# Patient Record
Sex: Male | Born: 1940 | Race: Black or African American | Hispanic: No | Marital: Married
Health system: Southern US, Community
[De-identification: ages and names within clinical notes are randomized; demographics above are authoritative.]

---

## 1997-05-23 ENCOUNTER — Encounter (HOSPITAL_COMMUNITY): Admission: RE | Admit: 1997-05-23 | Discharge: 1997-08-21 | Payer: Self-pay | Admitting: Cardiology

## 2002-02-15 ENCOUNTER — Encounter: Payer: Self-pay | Admitting: Emergency Medicine

## 2002-02-16 ENCOUNTER — Encounter: Payer: Self-pay | Admitting: Neurology

## 2002-02-16 ENCOUNTER — Inpatient Hospital Stay (HOSPITAL_COMMUNITY): Admission: EM | Admit: 2002-02-16 | Discharge: 2002-03-10 | Payer: Self-pay | Admitting: Emergency Medicine

## 2002-02-16 ENCOUNTER — Encounter: Payer: Self-pay | Admitting: Emergency Medicine

## 2002-02-16 ENCOUNTER — Encounter: Payer: Self-pay | Admitting: Cardiology

## 2002-02-17 ENCOUNTER — Encounter: Payer: Self-pay | Admitting: Emergency Medicine

## 2002-02-18 ENCOUNTER — Encounter: Payer: Self-pay | Admitting: Neurosurgery

## 2002-02-18 ENCOUNTER — Encounter: Payer: Self-pay | Admitting: Neurology

## 2002-02-19 ENCOUNTER — Encounter: Payer: Self-pay | Admitting: Neurology

## 2002-02-19 ENCOUNTER — Encounter: Payer: Self-pay | Admitting: Neurosurgery

## 2002-02-21 ENCOUNTER — Encounter: Payer: Self-pay | Admitting: Neurology

## 2002-03-04 ENCOUNTER — Encounter: Payer: Self-pay | Admitting: Neurology

## 2004-12-03 ENCOUNTER — Ambulatory Visit (HOSPITAL_COMMUNITY): Admission: RE | Admit: 2004-12-03 | Discharge: 2004-12-03 | Payer: Self-pay | Admitting: Internal Medicine

## 2004-12-18 ENCOUNTER — Emergency Department (HOSPITAL_COMMUNITY): Admission: EM | Admit: 2004-12-18 | Discharge: 2004-12-18 | Payer: Self-pay | Admitting: *Deleted

## 2004-12-25 ENCOUNTER — Ambulatory Visit (HOSPITAL_COMMUNITY): Admission: RE | Admit: 2004-12-25 | Discharge: 2004-12-25 | Payer: Self-pay | Admitting: Internal Medicine

## 2004-12-27 ENCOUNTER — Ambulatory Visit (HOSPITAL_COMMUNITY): Admission: RE | Admit: 2004-12-27 | Discharge: 2004-12-27 | Payer: Self-pay | Admitting: Internal Medicine

## 2005-06-09 ENCOUNTER — Ambulatory Visit (HOSPITAL_COMMUNITY): Admission: RE | Admit: 2005-06-09 | Discharge: 2005-06-09 | Payer: Self-pay | Admitting: *Deleted

## 2006-02-13 ENCOUNTER — Ambulatory Visit (HOSPITAL_COMMUNITY): Admission: RE | Admit: 2006-02-13 | Discharge: 2006-02-13 | Payer: Self-pay | Admitting: *Deleted

## 2007-10-07 ENCOUNTER — Emergency Department (HOSPITAL_COMMUNITY): Admission: EM | Admit: 2007-10-07 | Discharge: 2007-10-08 | Payer: Self-pay | Admitting: Emergency Medicine

## 2007-10-12 ENCOUNTER — Emergency Department (HOSPITAL_COMMUNITY): Admission: EM | Admit: 2007-10-12 | Discharge: 2007-10-12 | Payer: Self-pay | Admitting: Emergency Medicine

## 2008-03-14 ENCOUNTER — Ambulatory Visit (HOSPITAL_COMMUNITY): Admission: RE | Admit: 2008-03-14 | Discharge: 2008-03-14 | Payer: Self-pay | Admitting: Geriatric Medicine

## 2009-04-10 ENCOUNTER — Ambulatory Visit: Payer: Self-pay | Admitting: Cardiology

## 2009-04-10 ENCOUNTER — Inpatient Hospital Stay (HOSPITAL_COMMUNITY): Admission: EM | Admit: 2009-04-10 | Discharge: 2009-04-13 | Payer: Self-pay | Admitting: Emergency Medicine

## 2009-04-11 ENCOUNTER — Encounter (INDEPENDENT_AMBULATORY_CARE_PROVIDER_SITE_OTHER): Payer: Self-pay | Admitting: Internal Medicine

## 2009-07-20 ENCOUNTER — Inpatient Hospital Stay (HOSPITAL_COMMUNITY): Admission: EM | Admit: 2009-07-20 | Discharge: 2009-07-23 | Payer: Self-pay | Admitting: Emergency Medicine

## 2010-03-23 LAB — DIFFERENTIAL
Basophils Relative: 0 % (ref 0–1)
Eosinophils Absolute: 0 10*3/uL (ref 0.0–0.7)
Eosinophils Relative: 0 % (ref 0–5)
Lymphocytes Relative: 6 % — ABNORMAL LOW (ref 12–46)
Monocytes Absolute: 0.4 10*3/uL (ref 0.1–1.0)
Monocytes Relative: 4 % (ref 3–12)
Neutro Abs: 9.1 10*3/uL — ABNORMAL HIGH (ref 1.7–7.7)

## 2010-03-23 LAB — CBC
HCT: 28 % — ABNORMAL LOW (ref 39.0–52.0)
HCT: 31.9 % — ABNORMAL LOW (ref 39.0–52.0)
Hemoglobin: 9.6 g/dL — ABNORMAL LOW (ref 13.0–17.0)
MCH: 28 pg (ref 26.0–34.0)
MCH: 28.1 pg (ref 26.0–34.0)
MCV: 83.7 fL (ref 78.0–100.0)
MCV: 83.8 fL (ref 78.0–100.0)
Platelets: 267 10*3/uL (ref 150–400)
RBC: 3.33 MIL/uL — ABNORMAL LOW (ref 4.22–5.81)
RBC: 3.42 MIL/uL — ABNORMAL LOW (ref 4.22–5.81)
RDW: 13.6 % (ref 11.5–15.5)
WBC: 7.8 10*3/uL (ref 4.0–10.5)

## 2010-03-23 LAB — PREPARE FRESH FROZEN PLASMA

## 2010-03-23 LAB — HEMOGLOBIN AND HEMATOCRIT, BLOOD
HCT: 27.9 % — ABNORMAL LOW (ref 39.0–52.0)
HCT: 28.8 % — ABNORMAL LOW (ref 39.0–52.0)
HCT: 30 % — ABNORMAL LOW (ref 39.0–52.0)
Hemoglobin: 10 g/dL — ABNORMAL LOW (ref 13.0–17.0)
Hemoglobin: 9.3 g/dL — ABNORMAL LOW (ref 13.0–17.0)
Hemoglobin: 9.6 g/dL — ABNORMAL LOW (ref 13.0–17.0)

## 2010-03-23 LAB — BASIC METABOLIC PANEL
BUN: 9 mg/dL (ref 6–23)
CO2: 24 mEq/L (ref 19–32)
CO2: 27 mEq/L (ref 19–32)
Calcium: 9.2 mg/dL (ref 8.4–10.5)
Chloride: 109 mEq/L (ref 96–112)
Creatinine, Ser: 1.24 mg/dL (ref 0.4–1.5)
GFR calc non Af Amer: >60 mL/min (ref >60)
Glucose, Bld: 89 mg/dL (ref 70–99)
Glucose, Bld: 94 mg/dL (ref 70–99)

## 2010-03-23 LAB — COMPREHENSIVE METABOLIC PANEL
Alkaline Phosphatase: 80 U/L (ref 39–117)
BUN: 8 mg/dL (ref 6–23)
CO2: 23 mEq/L (ref 19–32)
Chloride: 107 mEq/L (ref 96–112)
GFR calc Af Amer: >60 mL/min (ref >60)
GFR calc non Af Amer: >60 mL/min (ref >60)
Glucose, Bld: 105 mg/dL — ABNORMAL HIGH (ref 70–99)
Potassium: 4.2 mEq/L (ref 3.5–5.1)
Sodium: 138 mEq/L (ref 135–145)
Total Bilirubin: 0.5 mg/dL (ref 0.3–1.2)

## 2010-03-23 LAB — HEMOCCULT GUIAC POC 1CARD (OFFICE): Fecal Occult Bld: POSITIVE

## 2010-03-23 LAB — URINE CULTURE
Colony Count: NO GROWTH
Culture: NO GROWTH

## 2010-03-23 LAB — PROTIME-INR: INR: 2.14 — ABNORMAL HIGH (ref 0.00–1.49)

## 2010-03-23 LAB — APTT: aPTT: 34 seconds (ref 24–37)

## 2010-03-23 LAB — URINE MICROSCOPIC-ADD ON

## 2010-03-23 LAB — HEPARIN LEVEL (UNFRACTIONATED)
Heparin Unfractionated: 0.24 IU/mL — ABNORMAL LOW (ref 0.30–0.70)
Heparin Unfractionated: 1.09 IU/mL — ABNORMAL HIGH (ref 0.30–0.70)

## 2010-03-23 LAB — URINALYSIS, ROUTINE W REFLEX MICROSCOPIC: Ketones, ur: NEGATIVE mg/dL

## 2010-03-23 LAB — TYPE AND SCREEN: ABO/RH(D): O POS

## 2010-03-27 LAB — URINALYSIS, ROUTINE W REFLEX MICROSCOPIC
Leukocytes, UA: NEGATIVE
Nitrite: NEGATIVE
Protein, ur: 100 mg/dL — AB
Urobilinogen, UA: 0.2 mg/dL (ref 0.0–1.0)

## 2010-03-27 LAB — PROTIME-INR
INR: 2.01 — ABNORMAL HIGH (ref 0.00–1.49)
INR: 2.16 — ABNORMAL HIGH (ref 0.00–1.49)
Prothrombin Time: 26.4 seconds — ABNORMAL HIGH (ref 11.6–15.2)

## 2010-03-27 LAB — BASIC METABOLIC PANEL
BUN: 8 mg/dL (ref 6–23)
BUN: 9 mg/dL (ref 6–23)
CO2: 27 mEq/L (ref 19–32)
Calcium: 9 mg/dL (ref 8.4–10.5)
Calcium: 9.7 mg/dL (ref 8.4–10.5)
Chloride: 103 mEq/L (ref 96–112)
Creatinine, Ser: 1.23 mg/dL (ref 0.4–1.5)
Creatinine, Ser: 1.37 mg/dL (ref 0.4–1.5)
GFR calc Af Amer: 55 mL/min — ABNORMAL LOW (ref >60)
GFR calc non Af Amer: 58 mL/min — ABNORMAL LOW (ref >60)
Glucose, Bld: 92 mg/dL (ref 70–99)
Glucose, Bld: 95 mg/dL (ref 70–99)
Sodium: 139 mEq/L (ref 135–145)

## 2010-03-27 LAB — CBC
HCT: 38.5 % — ABNORMAL LOW (ref 39.0–52.0)
HCT: 38.5 % — ABNORMAL LOW (ref 39.0–52.0)
HCT: 39.6 % (ref 39.0–52.0)
Hemoglobin: 13 g/dL (ref 13.0–17.0)
Hemoglobin: 13.4 g/dL (ref 13.0–17.0)
MCHC: 32.4 g/dL (ref 30.0–36.0)
MCHC: 32.5 g/dL (ref 30.0–36.0)
MCV: 84.3 fL (ref 78.0–100.0)
MCV: 84.7 fL (ref 78.0–100.0)
Platelets: 134 10*3/uL — ABNORMAL LOW (ref 150–400)
Platelets: 138 10*3/uL — ABNORMAL LOW (ref 150–400)
Platelets: 148 10*3/uL — ABNORMAL LOW (ref 150–400)
RBC: 4.93 MIL/uL (ref 4.22–5.81)
RDW: 12.7 % (ref 11.5–15.5)
RDW: 12.8 % (ref 11.5–15.5)
RDW: 13 % (ref 11.5–15.5)
WBC: 4.1 10*3/uL (ref 4.0–10.5)
WBC: 4.6 10*3/uL (ref 4.0–10.5)
WBC: 5.5 10*3/uL (ref 4.0–10.5)
WBC: 6.3 10*3/uL (ref 4.0–10.5)

## 2010-03-27 LAB — CARDIAC PANEL(CRET KIN+CKTOT+MB+TROPI)
CK, MB: 3.5 ng/mL (ref 0.3–4.0)
Relative Index: 0.3 (ref 0.0–2.5)
Relative Index: 0.3 (ref 0.0–2.5)
Total CK: 1102 U/L — ABNORMAL HIGH (ref 7–232)
Troponin I: 0.08 ng/mL — ABNORMAL HIGH (ref 0.00–0.06)
Troponin I: 0.09 ng/mL — ABNORMAL HIGH (ref 0.00–0.06)
Troponin I: 0.11 ng/mL — ABNORMAL HIGH (ref 0.00–0.06)

## 2010-03-27 LAB — TSH: TSH: 1.063 u[IU]/mL (ref 0.350–4.500)

## 2010-03-27 LAB — DIFFERENTIAL
Basophils Relative: 0 % (ref 0–1)
Lymphocytes Relative: 10 % — ABNORMAL LOW (ref 12–46)
Lymphs Abs: 0.6 10*3/uL — ABNORMAL LOW (ref 0.7–4.0)
Monocytes Absolute: 0.7 10*3/uL (ref 0.1–1.0)
Monocytes Relative: 11 % (ref 3–12)
Neutro Abs: 5 10*3/uL (ref 1.7–7.7)
Neutrophils Relative %: 80 % — ABNORMAL HIGH (ref 43–77)

## 2010-03-27 LAB — LACTIC ACID, PLASMA: Lactic Acid, Venous: 1.4 mmol/L (ref 0.5–2.2)

## 2010-03-27 LAB — URINE MICROSCOPIC-ADD ON

## 2010-03-27 LAB — CULTURE, BLOOD (ROUTINE X 2)

## 2010-03-27 LAB — LIPID PANEL
LDL Cholesterol: 67 mg/dL (ref 0–99)
Total CHOL/HDL Ratio: 2.2 RATIO
VLDL: 18 mg/dL (ref 0–40)

## 2010-03-27 LAB — URINE CULTURE
Colony Count: NO GROWTH
Culture: NO GROWTH

## 2010-03-27 LAB — CK
Total CK: 604 U/L — ABNORMAL HIGH (ref 7–232)
Total CK: 968 U/L — ABNORMAL HIGH (ref 7–232)

## 2010-03-27 LAB — TROPONIN I: Troponin I: 0.1 ng/mL — ABNORMAL HIGH (ref 0.00–0.06)

## 2010-03-27 LAB — CULTURE, BLOOD (SINGLE): Culture: NO GROWTH

## 2010-03-27 LAB — LEGIONELLA ANTIGEN, URINE

## 2010-03-27 LAB — CK TOTAL AND CKMB (NOT AT ARMC): CK, MB: 3.9 ng/mL (ref 0.3–4.0)

## 2010-05-24 NOTE — Consult Note (Signed)
NAME:  Daniel Robles, Daniel Robles                        ACCOUNT NO.:  [id number]   MEDICAL RECORD NO.:  [id number]                   PATIENT TYPE:  INP   LOCATION:  3108                                 FACILITY:  [facility]   PHYSICIAN:  Iva Boop, M.D. [facility]           DATE OF BIRTH:  [birth year]   DATE OF CONSULTATION:  02/19/2002  DATE OF DISCHARGE:                                   CONSULTATION   REQUESTING PHYSICIAN:  The stroke team.   REASON FOR CONSULTATION:  I am asked to see this patient regarding feeding  problems and dysphagia.   HISTORY:  This is a 70 year old [place] American man that was admitted with  acute expressive aphasia and was found to have a stroke.  MRI localized this  to the left putamen most likely.  He has had an aortic valve replacement and  was on chronic Coumadin, though he was felt subtherapeutic.  He has had some  difficulty with increased secretions and a speech pathology evaluation has  been performed and demonstrated aspiration problems.  Specifically, their  report indicates spillage of purees with unaware.  He had frank and silent  aspiration of honey-thick liquids and he was unable to move pudding  posteriorly into the oral cavity.  He is felt to be at high risk of  aspiration.  Currently, he has a Panda tube in, which he is tolerating the  feedings from that without difficulty.  He cannot really give history; his  daughter is present to speak to at this point.  He has been stable without  any neurologic deficits since admission.  His Coumadin has been held in  anticipation of possible procedure.   PAST MEDICAL HISTORY:  1. Peptic ulcer disease with GI bleeding.  The EGD showed gastric ulcer and     he stopped taking any nonsteroidals in 1998.  Colonoscopy showed     diverticulosis and internal hemorrhoids.  2. Aortic valve replacement in 1990.  3. Peripheral vascular disease.  4. History of alcohol abuse.  5. Hypertension.   MEDICATIONS:  1.  Hydrochlorothiazide.  2. Aspirin 81 mg daily.  3. Zocor.  4. Foltx.  5. Catapres patch.  6. Heparin drip.  7. Coumadin is being held.   DRUG ALLERGIES:  None known.   FAMILY HISTORY:  Noncontributory at this point.   SOCIAL HISTORY:  He was living alone.  His daughter is here.  She is his  only family.  There is a history of alcohol and smoking in the past.   REVIEW OF SYSTEMS:  Review of systems as noted above.  There is no complaint  of pain.  As best I can tell, there are no other active problems at this  time.  The expressive aphasia makes this very difficult.   PHYSICAL EXAMINATION:  GENERAL:  Physical exam reveals a pleasant, middle-  aged black man.  He follows simple commands.  His speech is  unintelligible.  VITAL SIGNS:  Vital signs today show temperature 99.3, blood pressure  140/78, pulse 50, respirations 18.  HEENT:  Eyes are anicteric.  There is a Panda tube in the nose.  Mouth free  of mucosal lesions.  NECK:  Neck supple.  No mass.  CHEST:  Chest clear.  HEART:  S1 and S2; there is a metallic S2.  I do not hear a murmur.  ABDOMEN:  The abdomen is thin, soft and nontender, without organomegaly or  mass.  Bowel sounds are present.  EXTREMITIES:  Extremities free of edema.  LYMPH NODES:  No neck or supraclavicular nodes palpated.  SKIN:  Skin is without rash.  NEUROLOGIC:  He has the expressive aphasia.  I believe there is some left  facial droop as well.  He does follow most simple commands reasonably well.  He cannot protrude the tongue.  His palate does not really rise much when  asked to say ah.  Gag is not tested.   LABORATORY DATA:  His INR is 2.8 today.  Hemoglobin 14.1, white count 6.7,  platelets 678,000.  PTT is 48.  Creatinine 1.5, BMET is otherwise normal.   ASSESSMENT AND PLAN:  Dysphagia with feeding problems.  He is an aspiration  risk; this is after his stroke.  At this point, he seems to be a reasonable  candidate for percutaneous gastrostomy  tube.  He will need nutritional  supplements, it remains to be seen whether this will be permanent or not,  depending upon his recovery and what that entails.  I have explained the  risks, benefits and indications to the patient and his daughter.  They  understand the risks include bleeding, infection, medication risks,  perforation and possible need for surgery.  He is probably at some increased  risk of problems, given his prosthetic heart valve, though he will be  covered with antibiotics to reduce the risk of endocarditis as well as to  reduce the risk of infection at the incision site.   I plan on performing this when his INR falls below 2.   I appreciate the opportunity to care for this patient.                                               Iva Boop, M.D. [facility]    CEG/MEDQ  D:  02/19/2002  T:  02/19/2002  Job:  [id number]   cc:   Neurology Stroke Team Office

## 2010-05-24 NOTE — Discharge Summary (Signed)
NAME:  Daniel Robles, Daniel Robles                        ACCOUNT NO.:  [id number]   MEDICAL RECORD NO.:  [id number]                   PATIENT TYPE:  INP   LOCATION:  3003                                 FACILITY:  [facility]   PHYSICIAN:  Marlan Palau, M.D.               DATE OF BIRTH:  1940-02-13   DATE OF ADMISSION:  02/15/2002  DATE OF DISCHARGE:  03/10/2002                                 DISCHARGE SUMMARY   ADMISSION DIAGNOSIS:  New onset of severe dysarthria secondary to a stroke  event.   DISCHARGE DIAGNOSES:  1. 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  2. Aortic valve replacement on chronic Coumadin therapy.  3. Malignant hypertension.   PROCEDURES:  1. CT of the brain.  2. MRI of the brain.  3. MRI angiogram.  4. A 2-D echocardiogram .  5. Carotid Doppler study.  6. Modified barium swallow.  7. PEG tube placement.   COMPLICATIONS:  There were no complications to the above procedures.   HISTORY OF PRESENT ILLNESS:  The patient is a 70 year old black male born  10-18-1940, with a history of significant hypertension.  He has had an  aortic valve replacement in 1990.  He has been on chronic Coumadin therapy.  The patient was brought into the hospital at this point for evaluation of  new onset of problems with speech and swallowing.  He was felt to have an  expressive aphagia initially.  The patient was felt to have a cardioembolic  stroke event and it was noted that his INR was not therapeutic initially  with INR of 1.3.  The patient had been living alone at home.  The patient  was brought into the hospital for further evaluation.   PAST MEDICAL HISTORY:  1. History of new onset of a cerebrovascular infarction involving the left     insular and deep white matter.  2. 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  3. Peripheral vascular disease.  4. Hypertension.  5. Peptic ulcer disease in 1998.   MEDICATION PRIOR TO ADMISSION:  Coumadin two in  the evening.   ALLERGIES:  No known drug allergies.   HABITS:  The patient has smoked cigarettes in the past and drinks a half cup  of rum a day.   Please refer to history and physical dictation summary for social history,  family history, review of systems and physical examination.   LABORATORY DATA:  INR currently of 2.8.  White count 6.8, hemoglobin 12.8,  hematocrit 38.1, MCV 86.0, platelets 265.  Sodium of 138, potassium 4.0,  chloride 102, CO2 28, glucose 129, BUN 11, creatinine 1.5, calcium 9.6.  Total protein 7.6, albumin 4.3, AST 29, ALT 22, ALP 78, total bilirubin 1.2.  Homocystine level was elevated at 21.26.  Hemoglobin A1C level of 5.9.  CK  level initially was 1008, troponin I level 0.02.  Cholesterol 292, HDL 69,  VLDL 15, LDL 208.  Urinalysis revealed specific gravity 1.023, pH 5.0,  ketones 15 mg/dL, protein 30 mg/dL and no white cells, 3-6 red cells.   Chest x-ray shows airway thickening suggestive of bronchitis.   EKG reveals normal sinus rhythm, moderate voltage criteria for LVH.  Nonspecific T-wave abnormality, heart rate 73.   HOSPITAL COURSE:  This patient was admitted to [facility] for an evaluation of severe dysarthria.  The patient underwent an  MRI scan of the brain that confirmed an acute left insular cortex deep white  matter infarct, multiple bihemispheric older strokes were seen.  There is an  old infarct in the left parietal area associated with some encephalomalacia  and gliosis, old hemorrhagic infarct in the right temporal region, old  posterior frontal infarct on the right is noted as well.  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  The patient did have a CT scan of the  brain initially showing old  strokes as above.   The patient has undergone a modified barium swallow that showed significant  difficulty with aspiration and penetration of nectar thick consistencies.  No aspiration was seen with honey-thick liquids.  This patient was unable,  however, to handle his own secretions and was felt to require a PEG tube  which was placed during this admission.   The patient was followed by speech therapy closely. A carotid Doppler study  showed no internal carotid artery stenosis, vertebral artery flow was  antegrade.  The patient has otherwise had good strength on all fours, is  ambulatory, he is getting around well.  Speech is so dysarthric that he is  unable to communicate verbally.  The patient has been placed on Foltx and  has been on some Zocor for his high cholesterol levels.  The patient has  indicated that he wishes to be discharged to home but it is felt that he is  unable to really be able to handle self-administration and food through the  PEG tube.  Daughter apparently is unable or unwilling to help this patient  following discharge.  For this reason, psychiatry was asked to see this  patient for evaluation of competence. The patient is felt not to be  competent to handle his own medical decisions and self care.  For this  reason, the patient was set up for a discharge to an extended are facility  where he can be monitored more closely and his Coumadin can be adequately  maintained and followed.  This patient is to be discharged to [facility] to have ongoing speech therapy at that time.   DISCHARGE MEDICATIONS:  1. Coumadin currently at 12.5 mg daily.  2. The patient will be on Jevity tube feeds, getting 360 mL at 7a.m., 10     a.m., 1 p.m., 4 p.m., 7 p.m.  The patient is being given 150 mL of water     q.8h. through the tube  3. Labetalol 200 mg one twice a day.  4. Lasix 40 mg/5 mL suspension getting 5 mL daily. 5. Prevacid 30 mg one a day.  6. Catapres  0.1 mg one twice a day.  7. Zocor 10 mg one daily.    FOLLOW UP:  The patient will follow up with [facility] within four to six weeks following discharge.  Coumadin will  continue to be followed through Dr. Bonnee Quin. Protime will need to be  checked within three to four days following discharge.  At the current time,  the patient is to be kept NPO.                                               Marlan Palau, M.D.    CKW/MEDQ  D:  03/09/2002  T:  03/09/2002  Job:  [id number]   cc:   Arturo Morton. Riley Kill, M.D. [facility]   [facility]  [address]  [address]

## 2010-05-24 NOTE — H&P (Signed)
NAME:  FLEMING, PRILL                        ACCOUNT NO.:  [id number]   MEDICAL RECORD NO.:  [id number]                   PATIENT TYPE:  INP   LOCATION:  1824                                 FACILITY:  [facility]   PHYSICIAN:  Deanna Artis. Sharene Skeans, M.D.           DATE OF BIRTH:  29-Aug-1940   DATE OF ADMISSION:  02/16/2002  DATE OF DISCHARGE:                                HISTORY & PHYSICAL   CHIEF COMPLAINT:  Cannot speak.   HISTORY OF THE PRESENT CONDITION:  The patient had sudden onset of inability  to speak around 9:30 p.m.  He presented to [facility] at 2310 and  was seen by Dr. Carleene Cooper 5 minutes later.  Cranial CT scan, 2332,  interpreted 2335, call to me placed at [phone].   The patient has not had new onset of weakness or visual changes.  He is not  able to speak well and therefore history is quite limited to the relevant  past medical history of this gentleman that was described in a 1998  admission for GI bleeding.   The patient has had an aortic valve replacement in 1990.  He has had no  revision since that time.  He has had problems with peripheral vascular  disease with claudication.  Risk factors are unknown to me.   The patient has had peptic ulcer disease, in 1998, which may have come about  also as a result of Coumadin overdose.  He had a GI bleed as a result of his  peptic ulcer disease and presented with anemia.   The patient's cranial CT scan currently shows remote infarctions in the  right frontal, left frontoparietal and left parietal.  There was a severe  subcortical confluent periventricular and centrum semiovale atherosclerotic  arteriopathy.  The patient had increased signal in the middle cerebral  artery, but I doubt that there is occlusion, based on his examination.   CURRENT MEDICATIONS:  His current medications only include Coumadin 5 mg two  in the evening.   ALLERGIES:  None known.   REVIEW OF SYSTEMS:  The patient is aphasic and we  cannot obtain this.   PAST SURGICAL HISTORY:  See above.   FAMILY HISTORY:  Unknown.   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  He smoked three-quarters of a package of cigarettes  per day and drank a half a cup of rum per day.   PHYSICAL EXAMINATION:  VITAL SIGNS:  On examination today, blood pressure  175/120, pulse 87, respirations 16, temperature 98.7, pulse oximetry 98%.  ENT/NECK:  No signs of infection.  No bruits.  Supple neck.  LUNGS:  Clear.  HEART:  Heart showed an aortic murmur with aortic stenosis and a click in  his valve.  ABDOMEN:  Abdomen soft.  Bowel sounds normal.  No hepatosplenomegaly.  EXTREMITIES:  Dry skin.  There  was no edema or cyanosis.  NEUROLOGIC:  The patient has a severe expressive aphasia.  He is virtually  unintelligible.  He is alert.  He follows commands.  He can tell me the  month.  He has difficulty telling me his age.  He points to objects  correctly.  Cranial nerves:  Round, reactive pupils.  Fundi normal.  There  may be mild optic atrophy on the right.  Visual fields full to counting  fingers, left central 7th (old).  Tongue is midline.  Patient understands  conversational speech.  Motor examination:  No drift.  Strength is 4/5.  Fine motor movements were clumsy, right equals left.  Sensory examination:  Withdrawal x4.  Stereoagnosis could not be tested.  Cerebellar examination:  Good finger-to-nose and rapid repetitive movements.  Gait was bilateral  broad-based, diplegic, unsteady.  Deep tendon reflexes were brisk in the  upper extremities, diminished in the lower extremities.  The patient had  bilateral flexor plantar responses.   IMPRESSION:  1. Cardioembolic stroke, likely source is the valve, extending to the left     middle cerebral artery with an ischemic infarction in that distribution     that cannot be seen on CT scan at this time and likely represents an     insular  cortex infarction, 434.11.  2. Malignant hypertension, 404.11.   PLAN:  Admit.  Heparin until Coumadin can be increased.  Treat blood  pressure gently.  This patient will need speech therapy for dysphasia and  dysphagia as well as PT and OT.                                               Deanna Artis. Sharene Skeans, M.D.    [facility]  D:  02/16/2002  T:  02/16/2002  Job:  [id number]   cc:   Arturo Morton. Riley Kill, M.D. [facility]  [address]  [place]  [place] [address]  Fax: 1

## 2010-10-08 LAB — CBC
HCT: 42.4
Hemoglobin: 13.9
MCHC: 32.8
MCV: 83.4
RBC: 5.08
WBC: 8.1

## 2010-10-08 LAB — CULTURE, BLOOD (ROUTINE X 2)
Culture: NO GROWTH
Culture: NO GROWTH

## 2010-10-08 LAB — DIFFERENTIAL
Eosinophils Absolute: 0
Eosinophils Relative: 0
Lymphs Abs: 0.9
Monocytes Absolute: 1
Monocytes Relative: 13 — ABNORMAL HIGH

## 2010-10-08 LAB — URINALYSIS, ROUTINE W REFLEX MICROSCOPIC
Glucose, UA: NEGATIVE
Hgb urine dipstick: NEGATIVE
pH: 5

## 2010-10-08 LAB — BASIC METABOLIC PANEL
CO2: 24
Chloride: 102
GFR calc Af Amer: 52 — ABNORMAL LOW
Potassium: 4.6

## 2010-10-08 LAB — PROTIME-INR: INR: 1.9 — ABNORMAL HIGH

## 2011-03-11 ENCOUNTER — Emergency Department (HOSPITAL_COMMUNITY)
Admission: EM | Admit: 2011-03-11 | Discharge: 2011-03-11 | Disposition: A | Discharge status: Home / Self Care | Payer: Medicare Other | Attending: Emergency Medicine | Admitting: Emergency Medicine

## 2011-03-11 ENCOUNTER — Emergency Department (HOSPITAL_COMMUNITY): Payer: Medicare Other

## 2011-03-11 DIAGNOSIS — R05 Cough: Secondary | ICD-10-CM | POA: Insufficient documentation

## 2011-03-11 DIAGNOSIS — R059 Cough, unspecified: Secondary | ICD-10-CM | POA: Insufficient documentation

## 2011-03-11 DIAGNOSIS — J40 Bronchitis, not specified as acute or chronic: Secondary | ICD-10-CM

## 2011-03-11 DIAGNOSIS — Z7901 Long term (current) use of anticoagulants: Secondary | ICD-10-CM | POA: Insufficient documentation

## 2011-03-11 DIAGNOSIS — R509 Fever, unspecified: Secondary | ICD-10-CM

## 2011-03-11 LAB — URINALYSIS, ROUTINE W REFLEX MICROSCOPIC
Bilirubin Urine: NEGATIVE
Leukocytes, UA: NEGATIVE
Nitrite: NEGATIVE
Specific Gravity, Urine: 1.018 (ref 1.005–1.030)
Urobilinogen, UA: 0.2 mg/dL (ref 0.0–1.0)
pH: 5.5 (ref 5.0–8.0)

## 2011-03-11 LAB — BASIC METABOLIC PANEL
BUN: 20 mg/dL (ref 6–23)
Chloride: 100 mEq/L (ref 96–112)
GFR calc Af Amer: 42 mL/min — ABNORMAL LOW (ref >90)
GFR calc non Af Amer: 36 mL/min — ABNORMAL LOW (ref >90)
Potassium: 4.9 mEq/L (ref 3.5–5.1)
Sodium: 138 mEq/L (ref 135–145)

## 2011-03-11 LAB — PROTIME-INR
INR: 1.49 (ref 0.00–1.49)
Prothrombin Time: 18.3 seconds — ABNORMAL HIGH (ref 11.6–15.2)

## 2011-03-11 LAB — CBC
Hemoglobin: 12.8 g/dL — ABNORMAL LOW (ref 13.0–17.0)
MCH: 26.1 pg (ref 26.0–34.0)
MCHC: 32.7 g/dL (ref 30.0–36.0)
Platelets: 229 10*3/uL (ref 150–400)
RDW: 13.4 % (ref 11.5–15.5)

## 2011-03-11 LAB — URINE MICROSCOPIC-ADD ON

## 2011-03-11 LAB — DIFFERENTIAL
Basophils Absolute: 0 10*3/uL (ref 0.0–0.1)
Basophils Relative: 0 % (ref 0–1)
Eosinophils Absolute: 0 10*3/uL (ref 0.0–0.7)
Monocytes Relative: 14 % — ABNORMAL HIGH (ref 3–12)
Neutro Abs: 7.3 10*3/uL (ref 1.7–7.7)
Neutrophils Relative %: 78 % — ABNORMAL HIGH (ref 43–77)

## 2011-03-11 MED ORDER — ACETAMINOPHEN 325 MG PO TABS: 650.0000 mg | ORAL_TABLET | Freq: Once | ORAL | Status: DC

## 2011-03-11 MED ORDER — AZITHROMYCIN 250 MG PO TABS: ORAL_TABLET | ORAL | Status: AC

## 2011-03-11 MED ORDER — ACETAMINOPHEN 650 MG RE SUPP
RECTAL | Status: AC
  Administered 2011-03-11: 650 mg via RECTAL
  Filled 2011-03-11: qty 1

## 2011-03-11 MED ORDER — ACETAMINOPHEN 650 MG RE SUPP
650.0000 mg | Freq: Once | RECTAL | Status: AC
  Administered 2011-03-11: 650 mg via RECTAL

## 2011-03-11 MED ORDER — SODIUM CHLORIDE 0.9 % IV SOLN: INTRAVENOUS | Status: DC

## 2011-03-11 MED ORDER — BENZONATATE 100 MG PO CAPS: 100.0000 mg | ORAL_CAPSULE | Freq: Three times a day (TID) | ORAL | Status: AC

## 2011-03-11 NOTE — ED Notes (Signed)
Per EMS pt from [facility], staff found pt beside bed. Unsure if pt fell. Pt reports got tripped over his pants. Pt is on blood thinners. Staff reports temp of 102. Pt is non-verbal, but is able to nod yes and no to questions, able to follow commands. Denies pain. No obvious injuries. Vital signs stable. 132/80 P 80.

## 2011-03-11 NOTE — ED Notes (Signed)
Pt denies pain, has had a couple of coughing spells while in ED. Pt states has had cough x 5 days. Pt given Tylenol Supp for fever.

## 2011-03-11 NOTE — Progress Notes (Signed)
Pt noted to not have a pcp nor insurance coverage. Pt confirms his has Medicare coverage

## 2011-03-11 NOTE — ED Provider Notes (Signed)
History     CSN: [id number]  Arrival date & time 03/11/11  1736   First MD Initiated Contact with Patient 03/11/11 1851      Chief Complaint  Patient presents with  . Fever    (Consider location/radiation/quality/duration/timing/severity/associated sxs/prior treatment) Patient is a 71 y.o. male presenting with fever. The history is provided by the nursing home. The history is limited by the condition of the patient.  Fever Primary symptoms of the febrile illness include fever and cough. Primary symptoms do not include headaches, abdominal pain or rash.   the patient is a 72 year old, male, who is a phasic.  He lives in a nursing home.  He was sent to the emergency department because he had a fever and cough.  He denies pain.  He has not had vomiting, or diarrhea.  No past medical history on file.  No past surgical history on file.  No family history on file.  History  Substance Use Topics  . Smoking status: Not on file  . Smokeless tobacco: Not on file  . Alcohol Use: Not on file      Review of Systems  Constitutional: Positive for fever.  HENT: Negative for neck pain.   Respiratory: Positive for cough.   Cardiovascular: Negative for chest pain.  Gastrointestinal: Negative for abdominal pain.  Genitourinary: Negative for flank pain.  Skin: Negative for rash.  Neurological: Negative for headaches.  Psychiatric/Behavioral: Negative for agitation.  All other systems reviewed and are negative.    Allergies  Fish allergy and Liver  Home Medications   Current Outpatient Rx  Name Route Sig Dispense Refill  . ATORVASTATIN CALCIUM 20 MG PO TABS Oral Take 20 mg by mouth at bedtime.    . CHOLECALCIFEROL 400 UNITS PO TABS Oral Take 400 Units by mouth daily.    . DONEPEZIL HCL 5 MG PO TABS Oral Take 5 mg by mouth at bedtime.    . ENALAPRIL MALEATE 5 MG PO TABS Oral Take 5 mg by mouth daily.    . GUAIFENESIN 100 MG/5ML PO SOLN Oral Take 10 mLs by mouth every 6 (six) hours  as needed. For cough    . MINERAL OIL HEAVY OIL Both Ears Place into both ears once a week. On Saturday's.    Marland Kitchen MUPIROCIN 2 % EX OINT Topical Apply 1 application topically 2 (two) times daily.    Marland Kitchen OMEPRAZOLE 40 MG PO CPDR Oral Take 40 mg by mouth daily. 30 minutes before breakfast    . TEMAZEPAM 7.5 MG PO CAPS Oral Take 7.5 mg by mouth at bedtime.    . TRAZODONE HCL 50 MG PO TABS Oral Take 50 mg by mouth at bedtime.    . TRIAMTERENE-HCTZ 37.5-25 MG PO TABS Oral Take 0.5 tablets by mouth daily.    . WARFARIN SODIUM 3 MG PO TABS Oral Take 3 mg by mouth See admin instructions. One tablet along with 4mg  tablet to = 7mg  every Tuesday's, Thursday's, Saturday's, and Sunday's in the evening.    . WARFARIN SODIUM 4 MG PO TABS Oral Take 4 mg by mouth See admin instructions. One tablet along with a 3mg  to = 7mg  every Tuesday's, Thursday's, Saturday's, and Sunday's in the evening.    . WARFARIN SODIUM 5 MG PO TABS Oral Take 5 mg by mouth See admin instructions. One tablet on Monday's, Wednesday's, and Friday's every evening.      BP 134/59  Pulse 84  Temp(Src) 101.5 F (38.6 C) (Rectal)  Resp 18  SpO2 96%  Physical Exam  Vitals reviewed. Constitutional: He appears well-developed and well-nourished.  HENT:  Head: Normocephalic and atraumatic.  Eyes: Conjunctivae are normal.  Neck: Normal range of motion. Neck supple.  Cardiovascular: Normal rate.   No murmur heard. Pulmonary/Chest: Effort normal. No respiratory distress. He has no wheezes. He has no rales.       Hacking cough  Abdominal: Soft. He exhibits no distension. There is no tenderness.  Musculoskeletal: Normal range of motion. He exhibits no edema.  Neurological: He is alert. No cranial nerve deficit.  Skin: Skin is warm and dry.  Psychiatric: He has a normal mood and affect.    ED Course  Procedures (including critical care time) 71 year old male, with fever, and cough.  Presents emergency department from the nursing home.  He  denies pain.  We will perform a chest x-ray, and laboratory testing, to look for an etiology for his fever. Labs Reviewed  CBC - Abnormal; Notable for the following:    Hemoglobin 12.8 (*)    All other components within normal limits  DIFFERENTIAL - Abnormal; Notable for the following:    Neutrophils Relative 78 (*)    Lymphocytes Relative 7 (*)    Monocytes Relative 14 (*)    Monocytes Absolute 1.3 (*)    All other components within normal limits  BASIC METABOLIC PANEL - Abnormal; Notable for the following:    Glucose, Bld 104 (*)    Creatinine, Ser 1.79 (*)    GFR calc non Af Amer 36 (*)    GFR calc Af Amer 42 (*)    All other components within normal limits  URINALYSIS, ROUTINE W REFLEX MICROSCOPIC - Abnormal; Notable for the following:    APPearance CLOUDY (*)    Ketones, ur TRACE (*)    Protein, ur 30 (*)    All other components within normal limits  URINE MICROSCOPIC-ADD ON - Abnormal; Notable for the following:    Squamous Epithelial / LPF FEW (*)    All other components within normal limits  PROTIME-INR - Abnormal; Notable for the following:    Prothrombin Time 18.3 (*)    All other components within normal limits   Dg Chest Port 1 View  03/11/2011  *RADIOLOGY REPORT*  Clinical Data: Shortness of breath  PORTABLE CHEST - 1 VIEW  Comparison: 04/12/2009  Findings: Previous median sternotomy wires noted.  Normal heart size and vascularity.  Negative for CHF, pneumonia, edema, collapse, consolidation, effusion or pneumothorax.  Slightly lower lung volumes.  IMPRESSION: No acute chest process  Original Report Authenticated By: Judie Petit. Ruel Favors, M.D.     No diagnosis found.  The patient has defervesced.  She is not hypoxic or in respiratory distress.  MDM  Fever with nonproductive cough, and no signs of pneumonia.  No toxicity.  No urinary tract infection        Nicholes Stairs, MD 03/11/11 2050

## 2011-03-11 NOTE — ED Notes (Signed)
[facility] called at this time for transport back to [facility]. Receiving RN aware and gave verbal report over the phone. Dressing patient in own clothes at this time; awaiting transport.

## 2011-03-11 NOTE — Discharge Instructions (Signed)
Your chest x-ray does not show pneumonia.  Your urine does not show any signs of infection.  Use Zithromax, for bronchitis, and Tessalon for cough.  Followup with your Dr. for reevaluation.  Return for worse or uncontrolled symptoms appear

## 2011-03-11 NOTE — Progress Notes (Signed)
Registration confirmed coverage

## 2011-03-11 NOTE — ED Notes (Signed)
Pt is usually ambulatory per staff and has been increasingly weaker. Pt able to be assisted to standing position.

## 2013-12-20 ENCOUNTER — Other Ambulatory Visit: Payer: Self-pay

## 2013-12-20 ENCOUNTER — Other Ambulatory Visit (HOSPITAL_COMMUNITY): Payer: Self-pay

## 2013-12-20 ENCOUNTER — Institutional Professional Consult (permissible substitution)
Admission: AD | Admit: 2013-12-20 | Discharge: 2014-02-06 | Disposition: E | Payer: Self-pay | Source: Ambulatory Visit | Attending: Internal Medicine | Admitting: Internal Medicine

## 2013-12-20 DIAGNOSIS — J9690 Respiratory failure, unspecified, unspecified whether with hypoxia or hypercapnia: Secondary | ICD-10-CM

## 2013-12-20 DIAGNOSIS — J9620 Acute and chronic respiratory failure, unspecified whether with hypoxia or hypercapnia: Secondary | ICD-10-CM

## 2013-12-20 DIAGNOSIS — J690 Pneumonitis due to inhalation of food and vomit: Secondary | ICD-10-CM

## 2013-12-20 DIAGNOSIS — R569 Unspecified convulsions: Secondary | ICD-10-CM

## 2013-12-20 DIAGNOSIS — Z931 Gastrostomy status: Secondary | ICD-10-CM

## 2013-12-20 DIAGNOSIS — F0391 Unspecified dementia with behavioral disturbance: Secondary | ICD-10-CM

## 2013-12-20 DIAGNOSIS — Z95828 Presence of other vascular implants and grafts: Secondary | ICD-10-CM

## 2013-12-20 DIAGNOSIS — A0472 Enterocolitis due to Clostridium difficile, not specified as recurrent: Secondary | ICD-10-CM

## 2013-12-20 DIAGNOSIS — F03918 Unspecified dementia, unspecified severity, with other behavioral disturbance: Secondary | ICD-10-CM

## 2013-12-20 DIAGNOSIS — I639 Cerebral infarction, unspecified: Secondary | ICD-10-CM

## 2013-12-20 DIAGNOSIS — J189 Pneumonia, unspecified organism: Secondary | ICD-10-CM

## 2013-12-20 DIAGNOSIS — Z431 Encounter for attention to gastrostomy: Secondary | ICD-10-CM

## 2013-12-20 DIAGNOSIS — I10 Essential (primary) hypertension: Secondary | ICD-10-CM

## 2013-12-20 DIAGNOSIS — Z9289 Personal history of other medical treatment: Secondary | ICD-10-CM

## 2013-12-20 DIAGNOSIS — R509 Fever, unspecified: Secondary | ICD-10-CM

## 2013-12-20 DIAGNOSIS — N179 Acute kidney failure, unspecified: Secondary | ICD-10-CM

## 2013-12-20 LAB — BLOOD GAS, ARTERIAL
ACID-BASE EXCESS: 0.1 mmol/L (ref 0.0–2.0)
Bicarbonate: 24 mEq/L (ref 20.0–24.0)
FIO2: 0.4 %
LHR: 12 {breaths}/min
MECHVT: 450 mL
O2 Saturation: 94.7 %
PCO2 ART: 37.1 mmHg (ref 35.0–45.0)
PEEP: 5 cmH2O
PH ART: 7.426 (ref 7.350–7.450)
PO2 ART: 67.8 mmHg — AB (ref 80.0–100.0)
Patient temperature: 98.6
TCO2: 25.1 mmol/L (ref 0–100)

## 2013-12-20 LAB — PROTIME-INR
INR: 1.31 (ref 0.00–1.49)
PROTHROMBIN TIME: 16.4 s — AB (ref 11.6–15.2)

## 2013-12-21 ENCOUNTER — Encounter: Payer: Self-pay | Admitting: Pulmonary Disease

## 2013-12-21 ENCOUNTER — Other Ambulatory Visit (HOSPITAL_COMMUNITY): Payer: Self-pay

## 2013-12-21 DIAGNOSIS — F03918 Unspecified dementia, unspecified severity, with other behavioral disturbance: Secondary | ICD-10-CM

## 2013-12-21 DIAGNOSIS — I10 Essential (primary) hypertension: Secondary | ICD-10-CM

## 2013-12-21 DIAGNOSIS — I639 Cerebral infarction, unspecified: Secondary | ICD-10-CM

## 2013-12-21 DIAGNOSIS — J189 Pneumonia, unspecified organism: Secondary | ICD-10-CM

## 2013-12-21 DIAGNOSIS — A0472 Enterocolitis due to Clostridium difficile, not specified as recurrent: Secondary | ICD-10-CM

## 2013-12-21 DIAGNOSIS — J9620 Acute and chronic respiratory failure, unspecified whether with hypoxia or hypercapnia: Secondary | ICD-10-CM

## 2013-12-21 DIAGNOSIS — Z431 Encounter for attention to gastrostomy: Secondary | ICD-10-CM

## 2013-12-21 DIAGNOSIS — F0391 Unspecified dementia with behavioral disturbance: Secondary | ICD-10-CM

## 2013-12-21 DIAGNOSIS — J9621 Acute and chronic respiratory failure with hypoxia: Secondary | ICD-10-CM

## 2013-12-21 DIAGNOSIS — N179 Acute kidney failure, unspecified: Secondary | ICD-10-CM

## 2013-12-21 DIAGNOSIS — J810 Acute pulmonary edema: Secondary | ICD-10-CM

## 2013-12-21 DIAGNOSIS — J690 Pneumonitis due to inhalation of food and vomit: Secondary | ICD-10-CM

## 2013-12-21 DIAGNOSIS — R569 Unspecified convulsions: Secondary | ICD-10-CM

## 2013-12-21 LAB — COMPREHENSIVE METABOLIC PANEL
ALBUMIN: 1.3 g/dL — AB (ref 3.5–5.2)
ALT: 98 U/L — ABNORMAL HIGH (ref 0–53)
AST: 109 U/L — AB (ref 0–37)
Alkaline Phosphatase: 206 U/L — ABNORMAL HIGH (ref 39–117)
Anion gap: 12 (ref 5–15)
BUN: 27 mg/dL — AB (ref 6–23)
CO2: 22 mEq/L (ref 19–32)
CREATININE: 1.27 mg/dL (ref 0.50–1.35)
Calcium: 8.5 mg/dL (ref 8.4–10.5)
Chloride: 105 mEq/L (ref 96–112)
GFR calc Af Amer: 63 mL/min — ABNORMAL LOW (ref >90)
GFR calc non Af Amer: 54 mL/min — ABNORMAL LOW (ref >90)
Glucose, Bld: 187 mg/dL — ABNORMAL HIGH (ref 70–99)
Potassium: 3.9 mEq/L (ref 3.7–5.3)
Sodium: 139 mEq/L (ref 137–147)
TOTAL PROTEIN: 7 g/dL (ref 6.0–8.3)
Total Bilirubin: 0.3 mg/dL (ref 0.3–1.2)

## 2013-12-21 LAB — CBC
HEMATOCRIT: 26.1 % — AB (ref 39.0–52.0)
Hemoglobin: 8.4 g/dL — ABNORMAL LOW (ref 13.0–17.0)
MCH: 26.6 pg (ref 26.0–34.0)
MCHC: 32.2 g/dL (ref 30.0–36.0)
MCV: 82.6 fL (ref 78.0–100.0)
PLATELETS: 493 10*3/uL — AB (ref 150–400)
RBC: 3.16 MIL/uL — ABNORMAL LOW (ref 4.22–5.81)
RDW: 14 % (ref 11.5–15.5)
WBC: 21 10*3/uL — ABNORMAL HIGH (ref 4.0–10.5)

## 2013-12-21 LAB — TSH: TSH: 1.07 u[IU]/mL (ref 0.350–4.500)

## 2013-12-21 LAB — PROTIME-INR
INR: 1.33 (ref 0.00–1.49)
Prothrombin Time: 16.6 seconds — ABNORMAL HIGH (ref 11.6–15.2)

## 2013-12-21 NOTE — Consult Note (Signed)
- Name: Daniel Robles MRN: [id number] DOB: 1940/09/27    ADMISSION DATE:  December 29, 2013 CONSULTATION DATE:  12/16  REFERRING MD :  [facility]  CHIEF COMPLAINT:  Resp failure  BRIEF PATIENT DESCRIPTION: 73 yo post cva , non verbal on vent  SIGNIFICANT EVENTS  12/11 intubated [facility]   STUDIES:     HISTORY OF PRESENT ILLNESS:   Daniel Robles is  73 yo AAM , NHP since cva left him non verbal and with dysphagia. He was found in respiratory distress 12/11 and was urgently intubated at [facility] and treated for aspiration pneumonia. He was ++ for Cdiff during his admission. Transferred to [facility] 12/15 fir either extubation or tracheostomy and PCCM asked to assist with vent management.  PAST MEDICAL HISTORY :     Acute on chronic respiratory failure   Aspiration pneumonia   Enteritis due to Clostridium difficile   Acute renal failure   Dementia with behavioral disturbance   HTN (hypertension)   CVA (cerebral infarction)   PEG (percutaneous endoscopic gastrostomy) adjustment/replacement/removal   Seizure   Prior to Admission medications   Medication Sig Start Date End Date Taking? Authorizing Provider  atorvastatin (LIPITOR) 20 MG tablet Take 20 mg by mouth at bedtime.    Historical Provider, MD  azithromycin (ZITHROMAX Z-PAK) 250 MG tablet Take 2 tablets on day 1, and then 1 tablet per day for the next 4 days. 03/11/11   Cheri Guppy, MD  cholecalciferol (VITAMIN D) 400 UNITS TABS Take 400 Units by mouth daily.    Historical Provider, MD  donepezil (ARICEPT) 5 MG tablet Take 5 mg by mouth at bedtime.    Historical Provider, MD  enalapril (VASOTEC) 5 MG tablet Take 5 mg by mouth daily.    Historical Provider, MD  guaiFENesin (ROBITUSSIN) 100 MG/5ML SOLN Take 10 mLs by mouth every 6 (six) hours as needed. For cough    Historical Provider, MD  Mineral Oil Heavy OIL Place into both ears once a week. On Saturday's.    Historical Provider, MD  mupirocin ointment (BACTROBAN) 2 % Apply 1 application  topically 2 (two) times daily.    Historical Provider, MD  omeprazole (PRILOSEC) 40 MG capsule Take 40 mg by mouth daily. 30 minutes before breakfast    Historical Provider, MD  temazepam (RESTORIL) 7.5 MG capsule Take 7.5 mg by mouth at bedtime.    Historical Provider, MD  traZODone (DESYREL) 50 MG tablet Take 50 mg by mouth at bedtime.    Historical Provider, MD  triamterene-hydrochlorothiazide (MAXZIDE-25) 37.5-25 MG per tablet Take 0.5 tablets by mouth daily.    Historical Provider, MD  warfarin (COUMADIN) 3 MG tablet Take 3 mg by mouth See admin instructions. One tablet along with 4mg  tablet to = 7mg  every Tuesday's, Thursday's, Saturday's, and Sunday's in the evening.    Historical Provider, MD  warfarin (COUMADIN) 4 MG tablet Take 4 mg by mouth See admin instructions. One tablet along with a 3mg  to = 7mg  every Tuesday's, Thursday's, Saturday's, and Sunday's in the evening.    Historical Provider, MD  warfarin (COUMADIN) 5 MG tablet Take 5 mg by mouth See admin instructions. One tablet on Monday's, Wednesday's, and Friday's every evening.    Historical Provider, MD   Allergies  Allergen Reactions  . Fish Allergy   . Liver     FAMILY HISTORY:  family history is not on file. SOCIAL HISTORY: NHP with dementia   REVIEW OF SYSTEMS:   na SUBJECTIVE:   VITAL SIGNS: Vital signs  reviewed. Abnormal values will appear under impression plan section.    PHYSICAL EXAMINATION: General:  Thin AAM sedated on vent Neuro:  No follows commands, wds to pain HEENT: No LAN/JVD/endntolous  Cardiovascular:  HSIR, SR freq PVC Lungs: Diminished in bases PS12-> SVt 410->Ve 10.1 l/m RR 28 fio2 ,35/5 peep-> 95% sat Abdomen:  Soft +bs . Musculoskeletal:  Intact Skin: warm and dry   Recent Labs Lab 12/21/13 0745  NA 139  K 3.9  CL 105  CO2 22  BUN 27*  CREATININE 1.27  GLUCOSE 187*    Recent Labs Lab 12/21/13 0745  HGB 8.4*  HCT 26.1*  WBC 21.0*  PLT 493*   Dg Chest Port 1  View  12/27/2013   CLINICAL DATA:  Hypoxia  EXAM: PORTABLE CHEST - 1 VIEW  COMPARISON:  December 17, 2013  FINDINGS: An image was initially obtained which did not include the lung bases. On that image, the endotracheal tube tip was 7.4 cm above the carina. A second images obtained to include the lung bases. At that time, the endotracheal tube is been advanced with the tip 4.3 cm above the carina. No pneumothorax.  There is left lower lobe consolidation with small left effusion. There is moderate interstitial edema, slightly more on the right than on the left. Heart is slightly enlarged with mild pulmonary venous hypertension. No adenopathy. Patient is status post median sternotomy.  IMPRESSION: Endotracheal tube as described without pneumothorax. Evidence of a degree of congestive heart failure. Consolidation the left lower lobe may represent alveolar edema, but superimposed pneumonia in the left lower lobe is questioned.   Electronically Signed   By: Bretta BangWilliam  Robles M.D.   On: 12/29/2013 17:41   Dg Abd Portable 1v  12/07/2013   CLINICAL DATA:  Percutaneous gastrostomy placement  EXAM: PORTABLE ABDOMEN - 1 VIEW  COMPARISON:  December 11, 2013.  FINDINGS: There is a gastrostomy catheter with the tip overlying the expected location of the stomach. The bowel gas pattern is unremarkable. No obstruction or free air.  IMPRESSION: Gastrostomy catheter tip overlies expected location of stomach. It may be reasonable to consider contrast injection to confirm catheter placement within stomach and absence of extravasation. Bowel gas pattern unremarkable.   Electronically Signed   By: Bretta BangWilliam  Robles M.D.   On: 12/12/2013 20:14    ASSESSMENT / PLAN: Active Problems:   Acute on chronic respiratory failure: Currently intubated and weaning   Aspiration pneumonia: Hx of dysphagia from CVA   Enteritis due to Clostridium difficile   Acute renal failure   Dementia with behavioral disturbance: Post cva and non verbal    HTN (hypertension)   CVA (cerebral infarction)on coumadin   PEG (percutaneous endoscopic gastrostomy) adjustment/replacement/removal   Seizure  Discussion: 73 yo AAM . Post CVA , non verbal, known dysphagia who aspir[facility] precedex drip but changed to fentanyl and versed 12/16. PCCM asked to help with weaning vs tracheostomy.   Wean per protocol if unable then tracheostomy Wean sedation as tolerated [facility] Further EOL discussions since NHP, non verbal post CVA   [facility] Surgery Center LPteve Minor ACNP Daniel PollackLe Robles PCCM Pager [phone] till 3 pm If no answer page [phone] 12/21/2013, 11:34 AM    PCCM ATTENDING: Pt seen on work rounds with care provider noted above. We reviewed pt's initial presentation, consultants notes and hospital database in detail.  The above assessment and plan was formulated under my direction.  In summary: Very  poor baseline health status Intubated 12/11 for suspected PNA Hospital course c/b C diff Remains orally intubated CXR c/w edema and or bilateral PNA Likelihood of recovering with any meaningful functional status is very low  Recommend working on sedation to allow consideration of extubation Strongly encourage that advanced directives continue to be addressed  Would favor not performing tracheostomy tube placement as this would be setting him up for a level of care that would probably be inappropriate Recommend diuresis  Discussed with Dr Duwayne HeckHijazi   Daniel Simonds, MD;  PCCM service; Mobile [phone]

## 2013-12-22 DIAGNOSIS — Z9911 Dependence on respirator [ventilator] status: Secondary | ICD-10-CM

## 2013-12-22 LAB — CULTURE, RESPIRATORY: CULTURE: NO GROWTH

## 2013-12-22 LAB — PROTIME-INR
INR: 1.27 (ref 0.00–1.49)
Prothrombin Time: 16.1 seconds — ABNORMAL HIGH (ref 11.6–15.2)

## 2013-12-22 LAB — CULTURE, RESPIRATORY W GRAM STAIN

## 2013-12-22 NOTE — Progress Notes (Signed)
- Name: Daniel Robles MRN: [id number] DOB: 09/20/1940    ADMISSION DATE:  01/20/2013 CONSULTATION DATE:  12/16  [facility]  CHIEF COMPLAINT:  Resp failure  BRIEF PATIENT DESCRIPTION: 73 yo post cva , non verbal on vent  SIGNIFICANT EVENTS  12/11 intubated [facility]   STUDIES:     HISTORY OF PRESENT ILLNESS:   Daniel Robles is  73 yo AAM , NHP since cva left him non verbal and with dysphagia. He was found in respiratory distress 12/11 and [facility] pneumonia. He was ++ for Cdiff during his admission. [facility] fir either extubation or tracheostomy and PCCM asked to assist with vent management.   SUBJECTIVE:   VITAL SIGNS: Vital signs reviewed. Abnormal values will appear under impression plan section.    PHYSICAL EXAMINATION: General:  Thin AAM on full  Vent support with increased wob Neuro:  No follows commands, wds to pain HEENT: No LAN/JVD/endntolous  Cardiovascular:  HSIR, SR freq PVC Lungs: Diminished in bases fio2 ,35/5 peep-> 95% sat Abdomen:  Soft +bs . Musculoskeletal:  Intact Skin: warm and dry   Recent Labs Lab 12/21/13 0745  NA 139  K 3.9  CL 105  CO2 22  BUN 27*  CREATININE 1.27  GLUCOSE 187*    Recent Labs Lab 12/21/13 0745  HGB 8.4*  HCT 26.1*  WBC 21.0*  PLT 493*   Dg Chest Port 1 View  12/21/2013   CLINICAL DATA:  PICC line placement  EXAM: PORTABLE CHEST - 1 VIEW  COMPARISON:  01/20/2013  FINDINGS: Cardiomediastinal silhouette is stable. Multifocal bilateral airspace disease again noted without change in aeration. Again findings may be due to multifocal pneumonia. Superimposed interstitial edema cannot be excluded. Small left pleural effusion left basilar atelectasis or infiltrate. Stable endotracheal tube position with tip 3.3 cm above the carina. There is right arm PICC line with tip in upper right atrium. For distal SVC position retraction about 1 cm is recommended. No  pneumothorax.  IMPRESSION: Again noted bilateral multifocal airspace disease. Small left pleural effusion with left basilar atelectasis or infiltrate. Stable endotracheal tube position. No pneumothorax. Right arm PICC line with tip in upper right atrium. For distal SVC position retraction about 1 cm is recommended.   Electronically Signed   By: Natasha MeadLiviu  Pop M.D.   On: 12/21/2013 16:04   Dg Chest Port 1 View  01/20/2013   CLINICAL DATA:  Hypoxia  EXAM: PORTABLE CHEST - 1 VIEW  COMPARISON:  December 17, 2013  FINDINGS: An image was initially obtained which did not include the lung bases. On that image, the endotracheal tube tip was 7.4 cm above the carina. A second images obtained to include the lung bases. At that time, the endotracheal tube is been advanced with the tip 4.3 cm above the carina. No pneumothorax.  There is left lower lobe consolidation with small left effusion. There is moderate interstitial edema, slightly more on the right than on the left. Heart is slightly enlarged with mild pulmonary venous hypertension. No adenopathy. Patient is status post median sternotomy.  IMPRESSION: Endotracheal tube as described without pneumothorax. Evidence of a degree of congestive heart failure. Consolidation the left lower lobe may represent alveolar edema, but superimposed pneumonia in the left lower lobe is questioned.   Electronically Signed   By: Bretta BangWilliam  Woodruff M.D.   On: 01/20/2013 17:41   Dg Abd Portable 1v  01/20/2013   CLINICAL DATA:  Percutaneous gastrostomy placement  EXAM: PORTABLE ABDOMEN - 1 VIEW  COMPARISON:  December 11, 2013.  FINDINGS: There is a gastrostomy catheter with the tip overlying the expected location of the stomach. The bowel gas pattern is unremarkable. No obstruction or free air.  IMPRESSION: Gastrostomy catheter tip overlies expected location of stomach. It may be reasonable to consider contrast injection to confirm catheter placement within stomach and absence of extravasation.  Bowel gas pattern unremarkable.   Electronically Signed   By: Bretta BangWilliam  Woodruff M.D.   On: 12/27/2013 20:14    ASSESSMENT / PLAN: Active Problems:   Acute on chronic respiratory failure: Currently intubated and weaning   Aspiration pneumonia: Hx of dysphagia from CVA   Enteritis due to Clostridium difficile   Acute renal failure   Dementia with behavioral disturbance: Post cva and non verbal   HTN (hypertension)   CVA (cerebral infarction)on coumadin   PEG (percutaneous endoscopic gastrostomy) adjustment/replacement/removal   Seizure  Discussion: 73 yo AAM . Post CVA , non verbal, known dysphagia who aspirated and [facility] on precedex drip but changed to fentanyl and versed 12/16. PCCM asked to help with weaning vs tracheostomy.   Wean per protocol if unable then tracheostomy. Doubt he will wean with in normal parameters. Wean sedation as tolerated Abx [facility] Further EOL discussions since NHP, non verbal post CVA. Poor chance at any meaningful quality of life.    Daniel Robles ACNP Daniel Robles PCCM Pager [phone] till 3 pm If no answer page [phone] 12/22/2013, 9:02 AM    PCCM ATTENDING: Pt seen on work rounds with care provider noted above. We reviewed pt's initial presentation, consultants notes and hospital database in detail.  The above assessment and plan was formulated under my direction.  He is making no progress weaning. His vent support is complicated excessive airway secretions. His baseline functional status was very poor and any survival from an illness such as this would certainly mean a significant decrement in functional status and QOL. As such, I strongly oppose trach tube placement. I have learned that he is a ward of the state. I have suggest that we make him DNR on basis of medical futility and that we attempt to optimize his status over the next few days with the plan to extubate and provide full comfort if he fails  extubation   Billy Fischeravid Simonds, MD;  PCCM service; Mobile [phone]

## 2013-12-23 ENCOUNTER — Other Ambulatory Visit (HOSPITAL_COMMUNITY): Payer: Self-pay

## 2013-12-23 LAB — PROTIME-INR
INR: 1.23 (ref 0.00–1.49)
Prothrombin Time: 15.6 seconds — ABNORMAL HIGH (ref 11.6–15.2)

## 2013-12-24 LAB — PROTIME-INR
INR: 1.35 (ref 0.00–1.49)
PROTHROMBIN TIME: 16.8 s — AB (ref 11.6–15.2)

## 2013-12-24 LAB — VANCOMYCIN, TROUGH: Vancomycin Tr: 15.8 ug/mL (ref 10.0–20.0)

## 2013-12-25 LAB — CBC
HCT: 26.1 % — ABNORMAL LOW (ref 39.0–52.0)
Hemoglobin: 8.2 g/dL — ABNORMAL LOW (ref 13.0–17.0)
MCH: 26.5 pg (ref 26.0–34.0)
MCHC: 31.4 g/dL (ref 30.0–36.0)
MCV: 84.2 fL (ref 78.0–100.0)
PLATELETS: 490 10*3/uL — AB (ref 150–400)
RBC: 3.1 MIL/uL — AB (ref 4.22–5.81)
RDW: 14.6 % (ref 11.5–15.5)
WBC: 18.8 10*3/uL — ABNORMAL HIGH (ref 4.0–10.5)

## 2013-12-25 LAB — BASIC METABOLIC PANEL
Anion gap: 13 (ref 5–15)
BUN: 50 mg/dL — ABNORMAL HIGH (ref 6–23)
CO2: 35 meq/L — AB (ref 19–32)
CREATININE: 1.4 mg/dL — AB (ref 0.50–1.35)
Calcium: 8.4 mg/dL (ref 8.4–10.5)
Chloride: 97 mEq/L (ref 96–112)
GFR calc Af Amer: 56 mL/min — ABNORMAL LOW (ref >90)
GFR calc non Af Amer: 48 mL/min — ABNORMAL LOW (ref >90)
Glucose, Bld: 308 mg/dL — ABNORMAL HIGH (ref 70–99)
Potassium: 3.6 mEq/L — ABNORMAL LOW (ref 3.7–5.3)
Sodium: 145 mEq/L (ref 137–147)

## 2013-12-25 LAB — PROTIME-INR
INR: 1.39 (ref 0.00–1.49)
Prothrombin Time: 17.2 seconds — ABNORMAL HIGH (ref 11.6–15.2)

## 2013-12-26 LAB — CBC WITH DIFFERENTIAL/PLATELET
BASOS ABS: 0 10*3/uL (ref 0.0–0.1)
Basophils Relative: 0 % (ref 0–1)
Eosinophils Absolute: 0 10*3/uL (ref 0.0–0.7)
Eosinophils Relative: 0 % (ref 0–5)
HCT: 26.8 % — ABNORMAL LOW (ref 39.0–52.0)
Hemoglobin: 8.5 g/dL — ABNORMAL LOW (ref 13.0–17.0)
LYMPHS PCT: 6 % — AB (ref 12–46)
Lymphs Abs: 0.9 10*3/uL (ref 0.7–4.0)
MCH: 26.7 pg (ref 26.0–34.0)
MCHC: 31.7 g/dL (ref 30.0–36.0)
MCV: 84.3 fL (ref 78.0–100.0)
MONOS PCT: 4 % (ref 3–12)
Monocytes Absolute: 0.6 10*3/uL (ref 0.1–1.0)
Neutro Abs: 14.3 10*3/uL — ABNORMAL HIGH (ref 1.7–7.7)
Neutrophils Relative %: 90 % — ABNORMAL HIGH (ref 43–77)
PLATELETS: 564 10*3/uL — AB (ref 150–400)
RBC: 3.18 MIL/uL — ABNORMAL LOW (ref 4.22–5.81)
RDW: 14.8 % (ref 11.5–15.5)
WBC: 15.8 10*3/uL — AB (ref 4.0–10.5)

## 2013-12-26 LAB — PROTIME-INR
INR: 1.5 — ABNORMAL HIGH (ref 0.00–1.49)
PROTHROMBIN TIME: 18.3 s — AB (ref 11.6–15.2)

## 2013-12-26 NOTE — Progress Notes (Signed)
- Name: Daniel Robles MRN: [id number] DOB: 04/12/1940    ADMISSION DATE:  12/15/2013 CONSULTATION DATE:  12/16  [facility]  CHIEF COMPLAINT:  Resp failure  BRIEF PATIENT DESCRIPTION: 73 yo post cva , non verbal on vent  SIGNIFICANT EVENTS  [facility]  [facility] status.   STUDIES:     HISTORY OF PRESENT ILLNESS:   Daniel Robles is  73 yo AAM , NHP since cva left him non verbal and with dysphagia. He was found in respiratory distress 12/11 and [facility] and [facility] for aspiration pneumonia. He was ++ for Cdiff during his admission. [facility] fir either extubation or tracheostomy and PCCM asked to assist with vent management.   SUBJECTIVE:   VITAL SIGNS: Vital signs reviewed. Abnormal values will appear under impression plan section.    PHYSICAL EXAMINATION: General:  Thin AAM on full  Vent support  Ps 12 5 ps Neuro:  No follows commands, wds to pain, no tracks or follows HEENT: No LAN/JVD/endntolous  Cardiovascular:  HSIR, SR freq PVC Lungs: Diminished in bases Abdomen:  Soft +bs . Musculoskeletal:  Intact Skin: warm and dry   Recent Labs Lab 12/21/13 0745 12/25/13 0540  NA 139 145  K 3.9 3.6*  CL 105 97  CO2 22 35*  BUN 27* 50*  CREATININE 1.27 1.40*  GLUCOSE 187* 308*    Recent Labs Lab 12/21/13 0745 12/25/13 0914 12/26/13 0500  HGB 8.4* 8.2* 8.5*  HCT 26.1* 26.1* 26.8*  WBC 21.0* 18.8* 15.8*  PLT 493* 490* 564*   No results found.  ASSESSMENT / PLAN: Active Problems:   Acute on chronic respiratory failure: Currently intubated and weaning   Aspiration pneumonia: Hx of dysphagia from CVA   Enteritis due to Clostridium difficile   Acute renal failure   Dementia with behavioral disturbance: Post cva and non verbal   HTN (hypertension)   CVA (cerebral infarction)on coumadin   PEG (percutaneous endoscopic gastrostomy) adjustment/replacement/removal    Seizure  Discussion: 73 yo AAM . Post CVA , non verbal, known dysphagia who aspirated and [facility] on precedex drip but changed to fentanyl and versed 12/16. PCCM asked to help with weaning vs tracheostomy.  Further EOL discussions since NHP, non verbal post CVA. Poor chance at any meaningful quality of life. He is a ward of the state. [facility] about code status and terminal wean.   Wean per protocol. Doubt he will wean with in normal parameters. Tracheostomy is poor option Wean sedation as tolerated Abx per [facility] Further EOL discussions since NHP, non verbal post CVA. Poor chance at any meaningful quality of life. He is a ward of the state. [facility] about code status and terminal wean.    Daniel CanalesSteve Robles ACNP Daniel Robles PCCM Pager [phone] till 3 pm If no answer page [phone] 12/26/2013, 9:05 AM    PCCM ATTENDING: He seems to tolerate lowest level of pressure support with acceptable ABG. I agree that tracheostomy would increase his burden and significantly worsen his quality of life. As such the discussion with his healthcare power of attorneys should focus on a one-way extubation with full DO NOT RESUSCITATE should his respiratory status worsen again. I would decrease his sedative medications- to see if his mental status improves enough for us to undertake such a trial of extubation, pending  the above discussion.  Daniel MilchALVA,Daniel V. MD

## 2013-12-27 LAB — BLOOD GAS, ARTERIAL
Acid-Base Excess: 19.3 mmol/L — ABNORMAL HIGH (ref 0.0–2.0)
Bicarbonate: 44.3 mEq/L — ABNORMAL HIGH (ref 20.0–24.0)
FIO2: 0.35 %
MECHVT: 547 mL
O2 Saturation: 95 %
PATIENT TEMPERATURE: 98.6
PCO2 ART: 56.8 mmHg — AB (ref 35.0–45.0)
PEEP: 5 cmH2O
Pressure support: 5 cmH2O
RATE: 17 resp/min
TCO2: 46 mmol/L (ref 0–100)
pH, Arterial: 7.503 — ABNORMAL HIGH (ref 7.350–7.450)
pO2, Arterial: 71.7 mmHg — ABNORMAL LOW (ref 80.0–100.0)

## 2013-12-27 LAB — PROTIME-INR
INR: 1.69 — ABNORMAL HIGH (ref 0.00–1.49)
Prothrombin Time: 20.1 seconds — ABNORMAL HIGH (ref 11.6–15.2)

## 2013-12-28 LAB — BLOOD GAS, ARTERIAL
ACID-BASE EXCESS: 19 mmol/L — AB (ref 0.0–2.0)
Bicarbonate: 44 mEq/L — ABNORMAL HIGH (ref 20.0–24.0)
FIO2: 0.35 %
MODE: POSITIVE
O2 SAT: 97 %
PEEP/CPAP: 5 cmH2O
PRESSURE SUPPORT: 5 cmH2O
Patient temperature: 98.6
TCO2: 45.7 mmol/L (ref 0–100)
pCO2 arterial: 56.6 mmHg — ABNORMAL HIGH (ref 35.0–45.0)
pH, Arterial: 7.502 — ABNORMAL HIGH (ref 7.350–7.450)
pO2, Arterial: 94.5 mmHg (ref 80.0–100.0)

## 2013-12-28 LAB — PROTIME-INR
INR: 1.71 — AB (ref 0.00–1.49)
Prothrombin Time: 20.3 seconds — ABNORMAL HIGH (ref 11.6–15.2)

## 2013-12-28 NOTE — Progress Notes (Signed)
- Name: Margot ChimesReginald D Card MRN: [id number] DOB: 04/18/1940    ADMISSION DATE:  January 07, 2013 CONSULTATION DATE:  12/16  [facility]  CHIEF COMPLAINT:  Resp failure  BRIEF PATIENT DESCRIPTION: 73 yo post cva , non verbal on vent  SIGNIFICANT EVENTS  [facility]  [facility] code status.   STUDIES:     HISTORY OF PRESENT ILLNESS:   [name] is  73 yo AAM , NHP since cva left him non verbal and with dysphagia. He was found in respiratory distress 12/11 and [facility] and treated for aspiration pneumonia. He was ++ for Cdiff during his admission. [facility] fir either extubation or tracheostomy and PCCM asked to assist with vent management.   SUBJECTIVE:   VITAL SIGNS: Vital signs reviewed. Abnormal values will appear under impression plan section.    PHYSICAL EXAMINATION: General:  Thin AAM on full  Vent support  Ps 12 5 ps Neuro:  No follows commands, wds to pain, no tracks or follows, on precedex HEENT: No LAN/JVD/endntolous  Cardiovascular:  HSIR, SR freq PVC Lungs: Diminished in bases Abdomen:  Soft +bs . Musculoskeletal:  Intact Skin: warm and dry   Recent Labs Lab 12/25/13 0540  NA 145  K 3.6*  CL 97  CO2 35*  BUN 50*  CREATININE 1.40*  GLUCOSE 308*    Recent Labs Lab 12/25/13 0914 12/26/13 0500  HGB 8.2* 8.5*  HCT 26.1* 26.8*  WBC 18.8* 15.8*  PLT 490* 564*   No results found.  ASSESSMENT / PLAN: Active Problems:   Acute on chronic respiratory failure: Currently intubated and weaning   Aspiration pneumonia: Hx of dysphagia from CVA   Enteritis due to Clostridium difficile   Acute renal failure   Dementia with behavioral disturbance: Post cva and non verbal   HTN (hypertension)   CVA (cerebral infarction)on coumadin   PEG (percutaneous endoscopic gastrostomy) adjustment/replacement/removal   Seizure  Discussion: 73 yo AAM . Post CVA , non verbal, known dysphagia  who aspir[facility] on precedex drip but changed to fentanyl and versed 12/16. PCCM asked to help with weaning vs tracheostomy.  Further EOL discussions since NHP, non verbal post CVA. Poor chance at any meaningful quality of life. He is a ward of the state. [facility] about code status and terminal wean.   Wean per protocol. Doubt he will wean with in normal parameters. Tracheostomy is poor option Wean sedation as tolerated [facility]x [facility] Further EOL discussions since NHP, non verbal post CVA. Poor chance at any meaningful quality of life. He is a ward of the state. [facility] about code status and terminal wean.  He tolerated PS 12 with precedex drip. Not weanable. We await input from state.  PCCM will see again on Monday.  Brett CanalesSteve Minor ACNP Adolph PollackLe Bauer PCCM Pager [phone] till 3 pm If no answer page [phone] 12/28/2013, 10:11 AM  PCCM ATTENDING:  Patient is currently on 12/5, if extubated now will expire.  [facility]-MD spoke with DPOA (patient is a ward of the state) and is now made DNR.  Would recommend lightening sedation then attempt a 5/5 wean, if fails then start morphine and comfort measure.  If successful then extubate with no intention to reintubate.  This was discussed with [facility]-MD.  PCCM will see again on Monday.  Alyson ReedyWesam G. Dannon Perlow,  M.D. [place] [facility]. Pager: [phone]. After hours pager: [phone]

## 2013-12-29 ENCOUNTER — Other Ambulatory Visit (HOSPITAL_COMMUNITY): Payer: Self-pay

## 2013-12-29 LAB — PROTIME-INR
INR: 1.01 (ref 0.00–1.49)
INR: 1.77 — AB (ref 0.00–1.49)
Prothrombin Time: 13.4 seconds (ref 11.6–15.2)
Prothrombin Time: 20.7 seconds — ABNORMAL HIGH (ref 11.6–15.2)

## 2013-12-29 LAB — APTT: aPTT: 21 seconds — ABNORMAL LOW (ref 24–37)

## 2013-12-29 NOTE — Progress Notes (Signed)
- Name: Daniel Robles: [id number] DOB: 04/30/1940    ADMISSION DATE:  Jan 31, 2013 CONSULTATION DATE:  12/16  [facility]  CHIEF COMPLAINT:  Resp failure  BRIEF PATIENT DESCRIPTION: 73 yo post cva , non verbal on vent  SIGNIFICANT EVENTS  [facility]  [facility] dialogue with state about code status.  12/23 reported to be DNR 12/24 remains full code by chart STUDIES:     HISTORY OF PRESENT ILLNESS:   Mr. Daniel Robles is  73 yo AAM , NHP since cva left him non verbal and with dysphagia. He was found in respiratory distress 12/11 and [facility] for aspiration pneumonia. He was ++ for Cdiff during his admission. [facility] fir either extubation or tracheostomy and PCCM asked to assist with vent management.   SUBJECTIVE:   VITAL SIGNS: Vital signs reviewed. Abnormal values will appear under impression plan section.    PHYSICAL EXAMINATION: General:  Thin AAM on full  Vent support  Ps 12 5 ps, increased wob Neuro:  No follows commands, wds to pain, no tracks or follows, on precedex HEENT: No LAN/JVD/endntolous  Cardiovascular:  HSIR, SR freq PVC Lungs: Diminished in bases, decreased rt side Abdomen:  Soft +bs . Musculoskeletal:  Intact Skin: warm and dry   Recent Labs Lab 12/25/13 0540  NA 145  K 3.6*  CL 97  CO2 35*  BUN 50*  CREATININE 1.40*  GLUCOSE 308*    Recent Labs Lab 12/25/13 0914 12/26/13 0500  HGB 8.2* 8.5*  HCT 26.1* 26.8*  WBC 18.8* 15.8*  PLT 490* 564*   No results found.  ASSESSMENT / PLAN: Active Problems:   Acute on chronic respiratory failure: Currently intubated and weaning   Aspiration pneumonia: Hx of dysphagia from CVA   Enteritis due to Clostridium difficile   Acute renal failure   Dementia with behavioral disturbance: Post cva and non verbal   HTN (hypertension)   CVA (cerebral infarction)on coumadin   PEG (percutaneous endoscopic gastrostomy)  adjustment/replacement/removal   Seizure  Discussion: 73 yo AAM . Post CVA , non verbal, known dysphagia who aspirated and [facility] on precedex drip but changed to fentanyl and versed 12/16. PCCM asked to help with weaning vs tracheostomy.  Further EOL discussions since NHP, non verbal post CVA. Poor chance at any meaningful quality of life. He is a ward of the state. [facility] status and terminal wean.   Wean per protocol. Doubt he will wean with in normal parameters. Tracheostomy is poor option Wean sedation as tolerated [facility] Further EOL discussions since NHP, non verbal post CVA. Poor chance at any meaningful quality of life. He is a ward of the state. [facility] about code status and terminal wean.  He tolerated PS 12 with precedex drip. Not weanable. We await input from state.  12/24 ET partial blocked. Chart states full code. May need to change ET Tube. CXR ordered  [facility]teve Minor ACNP [facility]ack[facility]auer PCCM Pager [phone] till 3 pm If no answer page [phone] 12/29/2013, 9:22 AM  PCCM ATTENDING:  Patient weaning this AM.  Likely will be able to extubate from a pulmonary mechanic standpoint, he is now a full DNR, if he fails extubation then transition to comfort measures.  PCCM will sign off, please call back if needed.  Patient seen and examined, agree  with above note.  I dictated the care and orders written for this patient under my direction.  Rush Farmer, MD [phone]

## 2013-12-30 LAB — CBC
HCT: 36.7 % — ABNORMAL LOW (ref 39.0–52.0)
Hemoglobin: 10.9 g/dL — ABNORMAL LOW (ref 13.0–17.0)
MCH: 26.7 pg (ref 26.0–34.0)
MCHC: 29.7 g/dL — ABNORMAL LOW (ref 30.0–36.0)
MCV: 90 fL (ref 78.0–100.0)
PLATELETS: 452 10*3/uL — AB (ref 150–400)
RBC: 4.08 MIL/uL — AB (ref 4.22–5.81)
RDW: 16.8 % — ABNORMAL HIGH (ref 11.5–15.5)
WBC: 20.5 10*3/uL — AB (ref 4.0–10.5)

## 2013-12-30 LAB — BASIC METABOLIC PANEL
ANION GAP: 12 (ref 5–15)
BUN: 102 mg/dL — ABNORMAL HIGH (ref 6–23)
CALCIUM: 9.2 mg/dL (ref 8.4–10.5)
CHLORIDE: 105 meq/L (ref 96–112)
CO2: 48 mmol/L (ref 19–32)
Creatinine, Ser: 1.87 mg/dL — ABNORMAL HIGH (ref 0.50–1.35)
GFR calc Af Amer: 39 mL/min — ABNORMAL LOW (ref >90)
GFR calc non Af Amer: 34 mL/min — ABNORMAL LOW (ref >90)
Glucose, Bld: 243 mg/dL — ABNORMAL HIGH (ref 70–99)
Potassium: 2.9 mmol/L — ABNORMAL LOW (ref 3.5–5.1)
Sodium: 165 mmol/L (ref 135–145)

## 2013-12-30 LAB — URINALYSIS, ROUTINE W REFLEX MICROSCOPIC
BILIRUBIN URINE: NEGATIVE
GLUCOSE, UA: 100 mg/dL — AB
HGB URINE DIPSTICK: NEGATIVE
Ketones, ur: NEGATIVE mg/dL
Nitrite: NEGATIVE
PROTEIN: NEGATIVE mg/dL
Specific Gravity, Urine: 1.017 (ref 1.005–1.030)
Urobilinogen, UA: 0.2 mg/dL (ref 0.0–1.0)
pH: 5 (ref 5.0–8.0)

## 2013-12-30 LAB — PROTIME-INR
INR: 1.98 — AB (ref 0.00–1.49)
Prothrombin Time: 22.7 seconds — ABNORMAL HIGH (ref 11.6–15.2)

## 2013-12-30 LAB — URINE MICROSCOPIC-ADD ON

## 2013-12-31 LAB — GRAM STAIN

## 2013-12-31 LAB — CBC
HCT: 32 % — ABNORMAL LOW (ref 39.0–52.0)
Hemoglobin: 9.4 g/dL — ABNORMAL LOW (ref 13.0–17.0)
MCH: 26.6 pg (ref 26.0–34.0)
MCHC: 29.4 g/dL — AB (ref 30.0–36.0)
MCV: 90.4 fL (ref 78.0–100.0)
PLATELETS: 371 10*3/uL (ref 150–400)
RBC: 3.54 MIL/uL — ABNORMAL LOW (ref 4.22–5.81)
RDW: 17 % — ABNORMAL HIGH (ref 11.5–15.5)
WBC: 19.1 10*3/uL — ABNORMAL HIGH (ref 4.0–10.5)

## 2013-12-31 LAB — BASIC METABOLIC PANEL
ANION GAP: 7 (ref 5–15)
BUN: 102 mg/dL — ABNORMAL HIGH (ref 6–23)
CALCIUM: 8.1 mg/dL — AB (ref 8.4–10.5)
CO2: 43 mmol/L (ref 19–32)
Chloride: 97 mEq/L (ref 96–112)
Creatinine, Ser: 1.83 mg/dL — ABNORMAL HIGH (ref 0.50–1.35)
GFR, EST AFRICAN AMERICAN: 40 mL/min — AB (ref >90)
GFR, EST NON AFRICAN AMERICAN: 35 mL/min — AB (ref >90)
Glucose, Bld: 611 mg/dL (ref 70–99)
Potassium: 4.9 mmol/L (ref 3.5–5.1)
SODIUM: 147 mmol/L — AB (ref 135–145)

## 2013-12-31 LAB — PROTIME-INR
INR: 2.75 — ABNORMAL HIGH (ref 0.00–1.49)
PROTHROMBIN TIME: 29.3 s — AB (ref 11.6–15.2)

## 2014-01-01 ENCOUNTER — Other Ambulatory Visit (HOSPITAL_COMMUNITY): Payer: Self-pay

## 2014-01-01 LAB — BASIC METABOLIC PANEL
ANION GAP: 8 (ref 5–15)
BUN: 100 mg/dL — ABNORMAL HIGH (ref 6–23)
CO2: 44 mmol/L — AB (ref 19–32)
Calcium: 8.5 mg/dL (ref 8.4–10.5)
Chloride: 103 mEq/L (ref 96–112)
Creatinine, Ser: 1.71 mg/dL — ABNORMAL HIGH (ref 0.50–1.35)
GFR calc Af Amer: 44 mL/min — ABNORMAL LOW (ref >90)
GFR calc non Af Amer: 38 mL/min — ABNORMAL LOW (ref >90)
Glucose, Bld: 381 mg/dL — ABNORMAL HIGH (ref 70–99)
POTASSIUM: 3.5 mmol/L (ref 3.5–5.1)
SODIUM: 155 mmol/L — AB (ref 135–145)

## 2014-01-01 LAB — URINE CULTURE

## 2014-01-01 LAB — URINE MICROSCOPIC-ADD ON

## 2014-01-01 LAB — PROTIME-INR
INR: 2.42 — ABNORMAL HIGH (ref 0.00–1.49)
Prothrombin Time: 26.5 seconds — ABNORMAL HIGH (ref 11.6–15.2)

## 2014-01-01 LAB — URINALYSIS, ROUTINE W REFLEX MICROSCOPIC
Bilirubin Urine: NEGATIVE
Glucose, UA: 100 mg/dL — AB
Ketones, ur: NEGATIVE mg/dL
Nitrite: NEGATIVE
Protein, ur: NEGATIVE mg/dL
SPECIFIC GRAVITY, URINE: 1.018 (ref 1.005–1.030)
Urobilinogen, UA: 0.2 mg/dL (ref 0.0–1.0)
pH: 6 (ref 5.0–8.0)

## 2014-01-02 LAB — PROTIME-INR
INR: 2.54 — ABNORMAL HIGH (ref 0.00–1.49)
PROTHROMBIN TIME: 27.5 s — AB (ref 11.6–15.2)

## 2014-01-02 LAB — GLUCOSE, RANDOM: Glucose, Bld: 364 mg/dL — ABNORMAL HIGH (ref 70–99)

## 2014-01-03 LAB — URINE CULTURE

## 2014-01-03 LAB — PROTIME-INR
INR: 2.6 — AB (ref 0.00–1.49)
Prothrombin Time: 28.1 seconds — ABNORMAL HIGH (ref 11.6–15.2)

## 2014-01-03 LAB — CULTURE, RESPIRATORY: GRAM STAIN: NONE SEEN

## 2014-01-03 LAB — CULTURE, RESPIRATORY W GRAM STAIN

## 2014-01-04 LAB — PROTIME-INR
INR: 3.75 — AB (ref 0.00–1.49)
Prothrombin Time: 37.3 seconds — ABNORMAL HIGH (ref 11.6–15.2)

## 2014-01-04 LAB — CULTURE, BLOOD (ROUTINE X 2)

## 2014-01-05 LAB — BASIC METABOLIC PANEL
Anion gap: 8 (ref 5–15)
BUN: 48 mg/dL — ABNORMAL HIGH (ref 6–23)
CALCIUM: 8.4 mg/dL (ref 8.4–10.5)
CO2: 39 mmol/L — ABNORMAL HIGH (ref 19–32)
Chloride: 95 mEq/L — ABNORMAL LOW (ref 96–112)
Creatinine, Ser: 0.97 mg/dL (ref 0.50–1.35)
GFR calc non Af Amer: 79 mL/min — ABNORMAL LOW (ref >90)
Glucose, Bld: 144 mg/dL — ABNORMAL HIGH (ref 70–99)
POTASSIUM: 2.5 mmol/L — AB (ref 3.5–5.1)
SODIUM: 142 mmol/L (ref 135–145)

## 2014-01-05 LAB — CBC
HCT: 29.9 % — ABNORMAL LOW (ref 39.0–52.0)
HEMOGLOBIN: 9.6 g/dL — AB (ref 13.0–17.0)
MCH: 27.6 pg (ref 26.0–34.0)
MCHC: 32.1 g/dL (ref 30.0–36.0)
MCV: 85.9 fL (ref 78.0–100.0)
Platelets: 222 10*3/uL (ref 150–400)
RBC: 3.48 MIL/uL — ABNORMAL LOW (ref 4.22–5.81)
RDW: 17.5 % — ABNORMAL HIGH (ref 11.5–15.5)
WBC: 24.8 10*3/uL — ABNORMAL HIGH (ref 4.0–10.5)

## 2014-01-05 LAB — PROTIME-INR
INR: 3.86 — AB (ref 0.00–1.49)
PROTHROMBIN TIME: 38.2 s — AB (ref 11.6–15.2)

## 2014-01-06 LAB — POTASSIUM
Potassium: 3.2 mmol/L — ABNORMAL LOW (ref 3.5–5.1)
Potassium: 3.2 mmol/L — ABNORMAL LOW (ref 3.5–5.1)

## 2014-01-06 LAB — PROTIME-INR
INR: 3.77 — AB (ref 0.00–1.49)
PROTHROMBIN TIME: 37.5 s — AB (ref 11.6–15.2)

## 2014-01-06 DEATH — deceased

## 2014-01-07 LAB — CBC WITH DIFFERENTIAL/PLATELET
Basophils Absolute: 0 10*3/uL (ref 0.0–0.1)
Basophils Relative: 0 % (ref 0–1)
EOS ABS: 0 10*3/uL (ref 0.0–0.7)
Eosinophils Relative: 0 % (ref 0–5)
HCT: 31.7 % — ABNORMAL LOW (ref 39.0–52.0)
HEMOGLOBIN: 10.2 g/dL — AB (ref 13.0–17.0)
LYMPHS ABS: 0.7 10*3/uL (ref 0.7–4.0)
Lymphocytes Relative: 2 % — ABNORMAL LOW (ref 12–46)
MCH: 28.3 pg (ref 26.0–34.0)
MCHC: 32.2 g/dL (ref 30.0–36.0)
MCV: 88.1 fL (ref 78.0–100.0)
MONO ABS: 0.1 10*3/uL (ref 0.1–1.0)
MONOS PCT: 0 % — AB (ref 3–12)
NEUTROS PCT: 97 % — AB (ref 43–77)
Neutro Abs: 29.8 10*3/uL — ABNORMAL HIGH (ref 1.7–7.7)
Platelets: 227 10*3/uL (ref 150–400)
RBC: 3.6 MIL/uL — ABNORMAL LOW (ref 4.22–5.81)
RDW: 19.1 % — ABNORMAL HIGH (ref 11.5–15.5)
WBC: 30.6 10*3/uL — ABNORMAL HIGH (ref 4.0–10.5)

## 2014-01-07 LAB — BASIC METABOLIC PANEL
Anion gap: 10 (ref 5–15)
BUN: 47 mg/dL — AB (ref 6–23)
CO2: 32 mmol/L (ref 19–32)
CREATININE: 0.8 mg/dL (ref 0.50–1.35)
Calcium: 8.2 mg/dL — ABNORMAL LOW (ref 8.4–10.5)
Chloride: 103 mEq/L (ref 96–112)
GFR calc Af Amer: >90 mL/min (ref >90)
GFR, EST NON AFRICAN AMERICAN: 86 mL/min — AB (ref >90)
GLUCOSE: 186 mg/dL — AB (ref 70–99)
Potassium: 3.9 mmol/L (ref 3.5–5.1)
Sodium: 145 mmol/L (ref 135–145)

## 2014-01-07 LAB — PROTIME-INR
INR: 3.01 — ABNORMAL HIGH (ref 0.00–1.49)
Prothrombin Time: 31.4 seconds — ABNORMAL HIGH (ref 11.6–15.2)

## 2014-01-07 LAB — MAGNESIUM: Magnesium: 2.1 mg/dL (ref 1.5–2.5)

## 2014-01-08 LAB — BASIC METABOLIC PANEL
Anion gap: 6 (ref 5–15)
BUN: 48 mg/dL — AB (ref 6–23)
CALCIUM: 8.1 mg/dL — AB (ref 8.4–10.5)
CO2: 35 mmol/L — ABNORMAL HIGH (ref 19–32)
Chloride: 105 mEq/L (ref 96–112)
Creatinine, Ser: 0.77 mg/dL (ref 0.50–1.35)
GFR calc Af Amer: >90 mL/min (ref >90)
GFR calc non Af Amer: 87 mL/min — ABNORMAL LOW (ref >90)
GLUCOSE: 193 mg/dL — AB (ref 70–99)
Potassium: 3.5 mmol/L (ref 3.5–5.1)
SODIUM: 146 mmol/L — AB (ref 135–145)

## 2014-01-08 LAB — URINALYSIS, ROUTINE W REFLEX MICROSCOPIC
Bilirubin Urine: NEGATIVE
Glucose, UA: 500 mg/dL — AB
KETONES UR: NEGATIVE mg/dL
NITRITE: NEGATIVE
PROTEIN: 100 mg/dL — AB
Specific Gravity, Urine: 1.018 (ref 1.005–1.030)
Urobilinogen, UA: 0.2 mg/dL (ref 0.0–1.0)
pH: 7 (ref 5.0–8.0)

## 2014-01-08 LAB — URINE MICROSCOPIC-ADD ON

## 2014-01-08 LAB — PROTIME-INR
INR: 2.77 — ABNORMAL HIGH (ref 0.00–1.49)
Prothrombin Time: 29.4 seconds — ABNORMAL HIGH (ref 11.6–15.2)

## 2014-01-08 LAB — CBC WITH DIFFERENTIAL/PLATELET
Basophils Absolute: 0 10*3/uL (ref 0.0–0.1)
Basophils Relative: 0 % (ref 0–1)
Eosinophils Absolute: 0 10*3/uL (ref 0.0–0.7)
Eosinophils Relative: 0 % (ref 0–5)
HCT: 33.2 % — ABNORMAL LOW (ref 39.0–52.0)
Hemoglobin: 10.8 g/dL — ABNORMAL LOW (ref 13.0–17.0)
Lymphocytes Relative: 2 % — ABNORMAL LOW (ref 12–46)
Lymphs Abs: 0.6 10*3/uL — ABNORMAL LOW (ref 0.7–4.0)
MCH: 28.6 pg (ref 26.0–34.0)
MCHC: 32.5 g/dL (ref 30.0–36.0)
MCV: 88.1 fL (ref 78.0–100.0)
Monocytes Absolute: 0.3 10*3/uL (ref 0.1–1.0)
Monocytes Relative: 1 % — ABNORMAL LOW (ref 3–12)
Neutro Abs: 28.5 10*3/uL — ABNORMAL HIGH (ref 1.7–7.7)
Neutrophils Relative %: 97 % — ABNORMAL HIGH (ref 43–77)
PLATELETS: 185 10*3/uL (ref 150–400)
RBC: 3.77 MIL/uL — AB (ref 4.22–5.81)
RDW: 20.1 % — ABNORMAL HIGH (ref 11.5–15.5)
WBC: 29.4 10*3/uL — ABNORMAL HIGH (ref 4.0–10.5)

## 2014-01-08 LAB — CULTURE, BLOOD (ROUTINE X 2): CULTURE: NO GROWTH

## 2014-01-09 ENCOUNTER — Other Ambulatory Visit (HOSPITAL_COMMUNITY): Payer: Self-pay

## 2014-01-09 LAB — CBC WITH DIFFERENTIAL/PLATELET
BASOS ABS: 0 10*3/uL (ref 0.0–0.1)
Basophils Relative: 0 % (ref 0–1)
EOS PCT: 0 % (ref 0–5)
Eosinophils Absolute: 0 10*3/uL (ref 0.0–0.7)
HEMATOCRIT: 34.6 % — AB (ref 39.0–52.0)
Hemoglobin: 10.7 g/dL — ABNORMAL LOW (ref 13.0–17.0)
Lymphocytes Relative: 1 % — ABNORMAL LOW (ref 12–46)
Lymphs Abs: 0.3 10*3/uL — ABNORMAL LOW (ref 0.7–4.0)
MCH: 27.4 pg (ref 26.0–34.0)
MCHC: 30.9 g/dL (ref 30.0–36.0)
MCV: 88.7 fL (ref 78.0–100.0)
MONO ABS: 0.3 10*3/uL (ref 0.1–1.0)
MONOS PCT: 1 % — AB (ref 3–12)
Neutro Abs: 27.4 10*3/uL — ABNORMAL HIGH (ref 1.7–7.7)
Neutrophils Relative %: 98 % — ABNORMAL HIGH (ref 43–77)
Platelets: 167 10*3/uL (ref 150–400)
RBC: 3.9 MIL/uL — AB (ref 4.22–5.81)
RDW: 20.5 % — ABNORMAL HIGH (ref 11.5–15.5)
WBC: 28 10*3/uL — ABNORMAL HIGH (ref 4.0–10.5)

## 2014-01-09 LAB — BASIC METABOLIC PANEL
Anion gap: 4 — ABNORMAL LOW (ref 5–15)
BUN: 48 mg/dL — ABNORMAL HIGH (ref 6–23)
CALCIUM: 8.2 mg/dL — AB (ref 8.4–10.5)
CO2: 36 mmol/L — ABNORMAL HIGH (ref 19–32)
Chloride: 107 mEq/L (ref 96–112)
Creatinine, Ser: 0.7 mg/dL (ref 0.50–1.35)
GFR calc Af Amer: >90 mL/min (ref >90)
GLUCOSE: 199 mg/dL — AB (ref 70–99)
Potassium: 3.6 mmol/L (ref 3.5–5.1)
Sodium: 147 mmol/L — ABNORMAL HIGH (ref 135–145)

## 2014-01-09 LAB — PROTIME-INR
INR: 2.68 — ABNORMAL HIGH (ref 0.00–1.49)
PROTHROMBIN TIME: 28.8 s — AB (ref 11.6–15.2)

## 2014-01-09 LAB — URINE CULTURE
Colony Count: NO GROWTH
Culture: NO GROWTH

## 2014-01-10 LAB — PROTIME-INR
INR: 2.76 — ABNORMAL HIGH (ref 0.00–1.49)
Prothrombin Time: 29.4 seconds — ABNORMAL HIGH (ref 11.6–15.2)

## 2014-01-11 LAB — PROTIME-INR
INR: 3.07 — AB (ref 0.00–1.49)
PROTHROMBIN TIME: 31.9 s — AB (ref 11.6–15.2)

## 2014-01-12 LAB — PROTIME-INR
INR: 2.7 — ABNORMAL HIGH (ref 0.00–1.49)
Prothrombin Time: 28.9 seconds — ABNORMAL HIGH (ref 11.6–15.2)

## 2014-01-12 LAB — CLOSTRIDIUM DIFFICILE BY PCR: CDIFFPCR: NEGATIVE

## 2014-01-13 LAB — CBC
HCT: 37.1 % — ABNORMAL LOW (ref 39.0–52.0)
Hemoglobin: 11.2 g/dL — ABNORMAL LOW (ref 13.0–17.0)
MCH: 27.9 pg (ref 26.0–34.0)
MCHC: 30.2 g/dL (ref 30.0–36.0)
MCV: 92.5 fL (ref 78.0–100.0)
PLATELETS: 83 10*3/uL — AB (ref 150–400)
RBC: 4.01 MIL/uL — ABNORMAL LOW (ref 4.22–5.81)
RDW: 22.4 % — AB (ref 11.5–15.5)
WBC: 22.4 10*3/uL — ABNORMAL HIGH (ref 4.0–10.5)

## 2014-01-13 LAB — BASIC METABOLIC PANEL
Anion gap: 8 (ref 5–15)
BUN: 109 mg/dL — AB (ref 6–23)
CO2: 31 mmol/L (ref 19–32)
Calcium: 8.2 mg/dL — ABNORMAL LOW (ref 8.4–10.5)
Chloride: 120 mEq/L — ABNORMAL HIGH (ref 96–112)
Creatinine, Ser: 1.41 mg/dL — ABNORMAL HIGH (ref 0.50–1.35)
GFR, EST AFRICAN AMERICAN: 55 mL/min — AB (ref >90)
GFR, EST NON AFRICAN AMERICAN: 48 mL/min — AB (ref >90)
Glucose, Bld: 170 mg/dL — ABNORMAL HIGH (ref 70–99)
Potassium: 3.7 mmol/L (ref 3.5–5.1)
Sodium: 159 mmol/L — ABNORMAL HIGH (ref 135–145)

## 2014-01-13 LAB — PROTIME-INR
INR: 2.14 — ABNORMAL HIGH (ref 0.00–1.49)
Prothrombin Time: 24.1 seconds — ABNORMAL HIGH (ref 11.6–15.2)

## 2014-01-14 ENCOUNTER — Other Ambulatory Visit (HOSPITAL_COMMUNITY): Payer: Self-pay

## 2014-01-14 LAB — BASIC METABOLIC PANEL
ANION GAP: 13 (ref 5–15)
BUN: 124 mg/dL — ABNORMAL HIGH (ref 6–23)
CALCIUM: 7.4 mg/dL — AB (ref 8.4–10.5)
CO2: 28 mmol/L (ref 19–32)
Chloride: 111 mEq/L (ref 96–112)
Creatinine, Ser: 1.54 mg/dL — ABNORMAL HIGH (ref 0.50–1.35)
GFR calc Af Amer: 50 mL/min — ABNORMAL LOW (ref >90)
GFR calc non Af Amer: 43 mL/min — ABNORMAL LOW (ref >90)
Glucose, Bld: 315 mg/dL — ABNORMAL HIGH (ref 70–99)
Potassium: 3.5 mmol/L (ref 3.5–5.1)
SODIUM: 152 mmol/L — AB (ref 135–145)

## 2014-01-14 LAB — CBC
HCT: 32.8 % — ABNORMAL LOW (ref 39.0–52.0)
Hemoglobin: 10.2 g/dL — ABNORMAL LOW (ref 13.0–17.0)
MCH: 28.9 pg (ref 26.0–34.0)
MCHC: 31.1 g/dL (ref 30.0–36.0)
MCV: 92.9 fL (ref 78.0–100.0)
Platelets: 64 10*3/uL — ABNORMAL LOW (ref 150–400)
RBC: 3.53 MIL/uL — ABNORMAL LOW (ref 4.22–5.81)
RDW: 21.6 % — ABNORMAL HIGH (ref 11.5–15.5)
WBC: 16.5 10*3/uL — AB (ref 4.0–10.5)

## 2014-01-14 LAB — PROTIME-INR
INR: 2.33 — ABNORMAL HIGH (ref 0.00–1.49)
PROTHROMBIN TIME: 25.8 s — AB (ref 11.6–15.2)

## 2014-01-15 LAB — COMPREHENSIVE METABOLIC PANEL
ALK PHOS: 94 U/L (ref 39–117)
ALT: 374 U/L — AB (ref 0–53)
AST: 293 U/L — ABNORMAL HIGH (ref 0–37)
Albumin: 1.3 g/dL — ABNORMAL LOW (ref 3.5–5.2)
Anion gap: 7 (ref 5–15)
BUN: 113 mg/dL — AB (ref 6–23)
CO2: 29 mmol/L (ref 19–32)
CREATININE: 1.48 mg/dL — AB (ref 0.50–1.35)
Calcium: 7.5 mg/dL — ABNORMAL LOW (ref 8.4–10.5)
Chloride: 112 mEq/L (ref 96–112)
GFR calc Af Amer: 52 mL/min — ABNORMAL LOW (ref >90)
GFR calc non Af Amer: 45 mL/min — ABNORMAL LOW (ref >90)
Glucose, Bld: 93 mg/dL (ref 70–99)
POTASSIUM: 3.5 mmol/L (ref 3.5–5.1)
Sodium: 148 mmol/L — ABNORMAL HIGH (ref 135–145)
Total Bilirubin: 0.7 mg/dL (ref 0.3–1.2)
Total Protein: 3.5 g/dL — ABNORMAL LOW (ref 6.0–8.3)

## 2014-01-15 LAB — CBC
HEMATOCRIT: 30 % — AB (ref 39.0–52.0)
Hemoglobin: 9.3 g/dL — ABNORMAL LOW (ref 13.0–17.0)
MCH: 28.5 pg (ref 26.0–34.0)
MCHC: 31 g/dL (ref 30.0–36.0)
MCV: 92 fL (ref 78.0–100.0)
Platelets: 53 10*3/uL — ABNORMAL LOW (ref 150–400)
RBC: 3.26 MIL/uL — ABNORMAL LOW (ref 4.22–5.81)
RDW: 21.4 % — AB (ref 11.5–15.5)
WBC: 8.5 10*3/uL (ref 4.0–10.5)

## 2014-01-15 LAB — PROTIME-INR
INR: 2.81 — ABNORMAL HIGH (ref 0.00–1.49)
Prothrombin Time: 29.8 seconds — ABNORMAL HIGH (ref 11.6–15.2)

## 2014-01-16 LAB — CBC
HCT: 26.9 % — ABNORMAL LOW (ref 39.0–52.0)
HEMOGLOBIN: 8.5 g/dL — AB (ref 13.0–17.0)
MCH: 28.8 pg (ref 26.0–34.0)
MCHC: 31.6 g/dL (ref 30.0–36.0)
MCV: 91.2 fL (ref 78.0–100.0)
Platelets: 32 10*3/uL — ABNORMAL LOW (ref 150–400)
RBC: 2.95 MIL/uL — ABNORMAL LOW (ref 4.22–5.81)
RDW: 21.2 % — AB (ref 11.5–15.5)
WBC: 4.9 10*3/uL (ref 4.0–10.5)

## 2014-01-16 LAB — BASIC METABOLIC PANEL
ANION GAP: 7 (ref 5–15)
BUN: 105 mg/dL — ABNORMAL HIGH (ref 6–23)
CHLORIDE: 105 meq/L (ref 96–112)
CO2: 28 mmol/L (ref 19–32)
CREATININE: 1.62 mg/dL — AB (ref 0.50–1.35)
Calcium: 7.1 mg/dL — ABNORMAL LOW (ref 8.4–10.5)
GFR calc Af Amer: 47 mL/min — ABNORMAL LOW (ref >90)
GFR calc non Af Amer: 40 mL/min — ABNORMAL LOW (ref >90)
Glucose, Bld: 253 mg/dL — ABNORMAL HIGH (ref 70–99)
Potassium: 3.4 mmol/L — ABNORMAL LOW (ref 3.5–5.1)
Sodium: 140 mmol/L (ref 135–145)

## 2014-01-16 LAB — PROTIME-INR
INR: 2.36 — ABNORMAL HIGH (ref 0.00–1.49)
PROTHROMBIN TIME: 26 s — AB (ref 11.6–15.2)

## 2014-01-17 LAB — CULTURE, BLOOD (ROUTINE X 2)
Culture: NO GROWTH
Culture: NO GROWTH

## 2014-01-17 LAB — PROTIME-INR
INR: 1.77 — ABNORMAL HIGH (ref 0.00–1.49)
Prothrombin Time: 20.8 seconds — ABNORMAL HIGH (ref 11.6–15.2)

## 2014-01-18 LAB — CBC
HCT: 24.2 % — ABNORMAL LOW (ref 39.0–52.0)
Hemoglobin: 7.8 g/dL — ABNORMAL LOW (ref 13.0–17.0)
MCH: 28.9 pg (ref 26.0–34.0)
MCHC: 32.2 g/dL (ref 30.0–36.0)
MCV: 89.6 fL (ref 78.0–100.0)
PLATELETS: 24 10*3/uL — AB (ref 150–400)
RBC: 2.7 MIL/uL — ABNORMAL LOW (ref 4.22–5.81)
RDW: 21.1 % — AB (ref 11.5–15.5)
WBC: 1.5 10*3/uL — AB (ref 4.0–10.5)

## 2014-01-18 LAB — PROTIME-INR
INR: 1.46 (ref 0.00–1.49)
Prothrombin Time: 17.9 seconds — ABNORMAL HIGH (ref 11.6–15.2)

## 2014-02-06 DEATH — deceased

## 2016-03-03 IMAGING — CR DG CHEST 1V PORT
2 series · 2 of 2 positions shown · non-contrast
Comparison: December 17, 2013

CLINICAL DATA: Hypoxia

EXAM:
PORTABLE CHEST - 1 VIEW

[portable (1 of 2)]
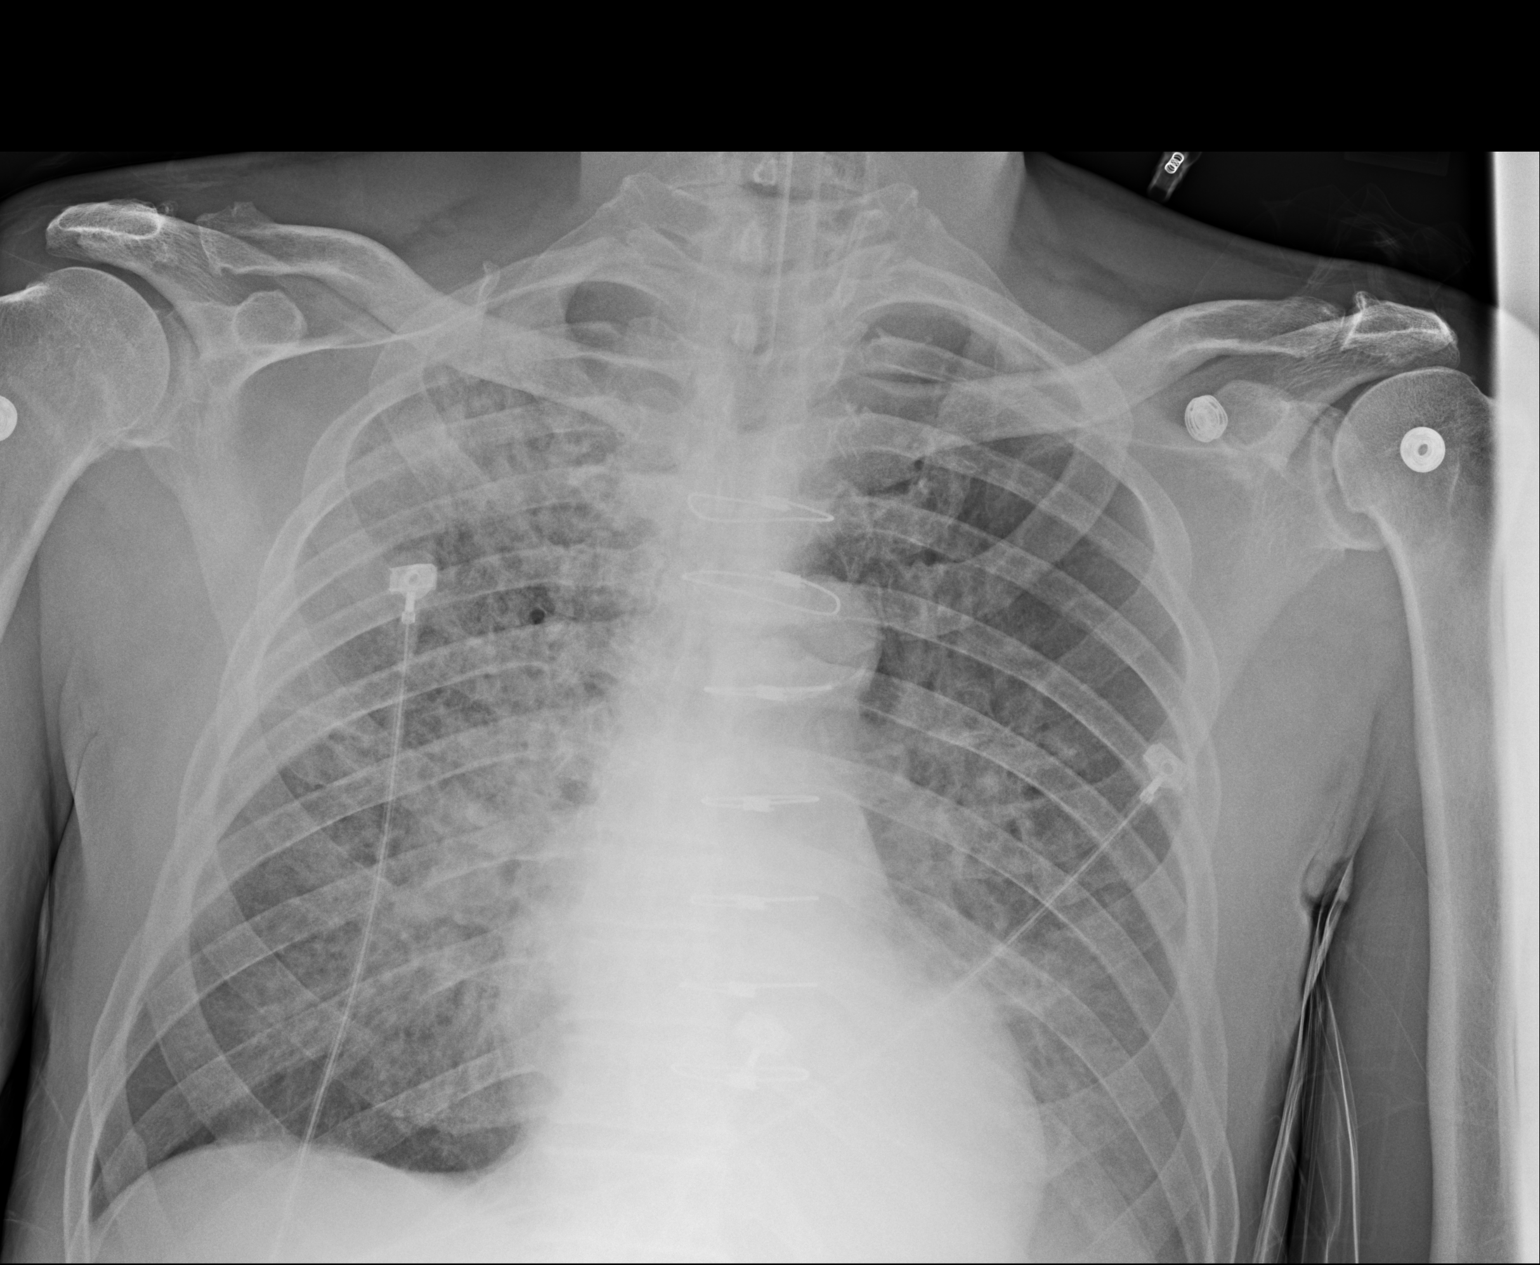

[portable (2 of 2)]
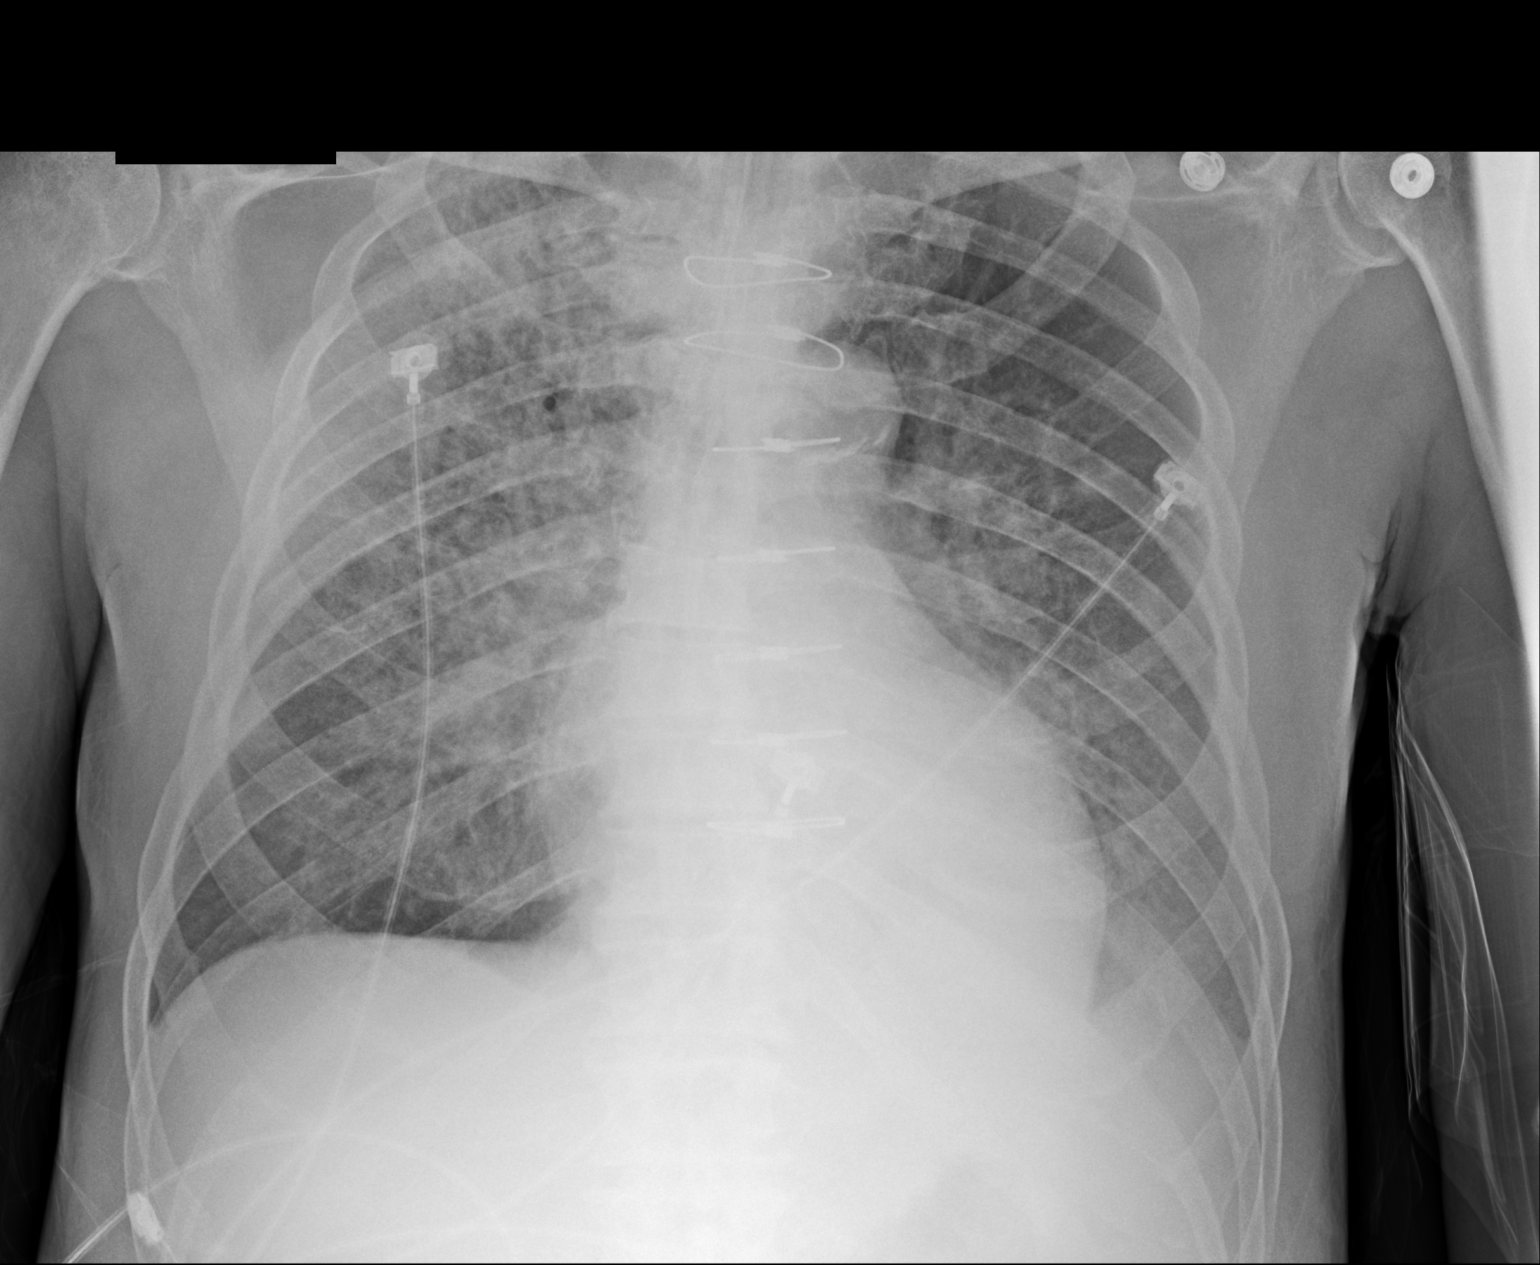

[2 of 2 positions shown; findings below may reference images not displayed]

FINDINGS: An image was initially obtained which did not include the lung
bases. On that image, the endotracheal tube tip was 7.4 cm above the
carina. A second images obtained to include the lung bases. At that
time, the endotracheal tube is been advanced with the tip 4.3 cm
above the carina. No pneumothorax.

There is left lower lobe consolidation with small left effusion.
There is moderate interstitial edema, slightly more on the right
than on the left. Heart is slightly enlarged with mild pulmonary
venous hypertension. No adenopathy. Patient is status post median
sternotomy.
IMPRESSION: Endotracheal tube as described without pneumothorax. Evidence of a
degree of congestive heart failure. Consolidation the left lower
lobe may represent alveolar edema, but superimposed pneumonia in the
left lower lobe is questioned.

## 2016-03-15 IMAGING — CR DG CHEST 1V PORT
1 series · 1 of 1 positions shown · non-contrast
Comparison: 12/29/2013

CLINICAL DATA: Fever

EXAM:
PORTABLE CHEST - 1 VIEW

[AP]
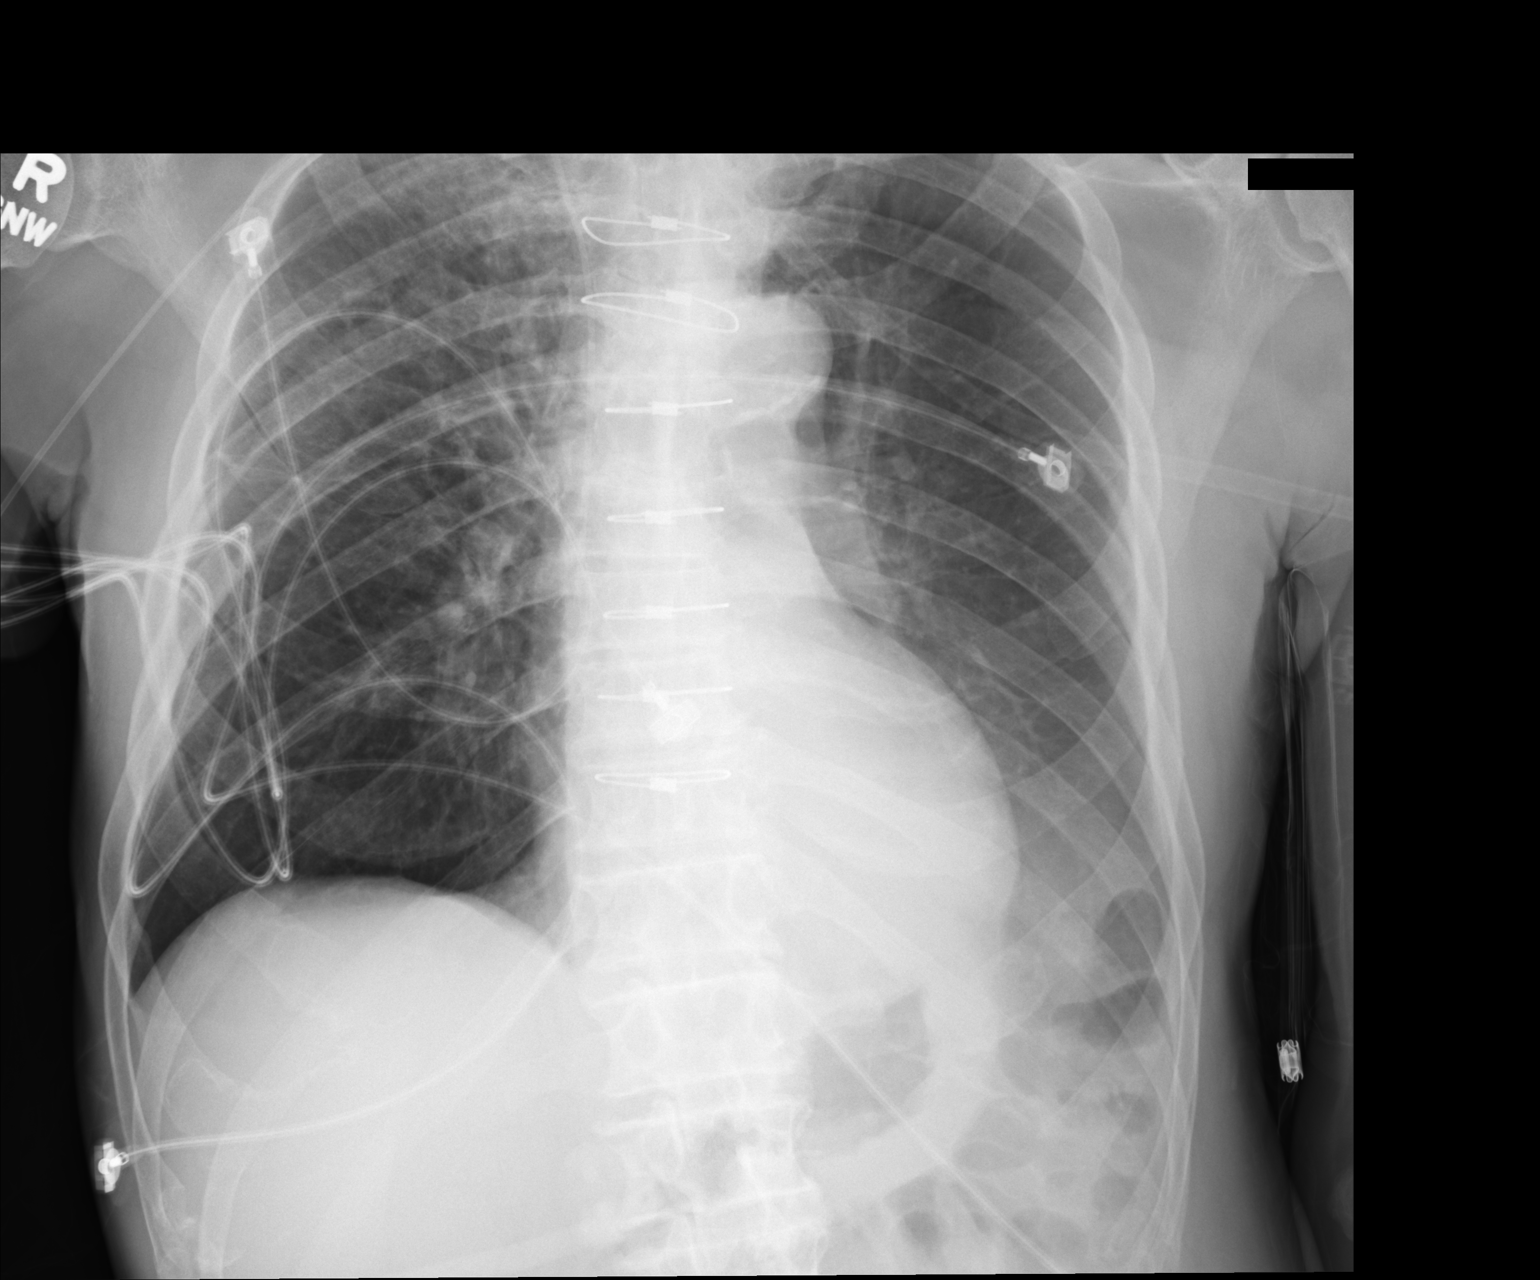

[1 of 1 positions shown; findings below may reference images not displayed]

FINDINGS: Cardiomediastinal silhouette is stable. Status post median
sternotomy. Stable right arm PICC line position. Endotracheal tube
has been removed. Probable small left pleural effusion with left
basilar atelectasis. Streaky atelectasis or infiltrate in right
upper lobe. No pulmonary edema.
IMPRESSION: Endotracheal tube has been removed. Probable small left pleural
effusion with left basilar atelectasis. Streaky atelectasis or
infiltrate in right upper lobe. No pulmonary edema.
